# Patient Record
Sex: Female | Born: 1950
Health system: Southern US, Community
[De-identification: ages and names within clinical notes are randomized; demographics above are authoritative.]

## PROBLEM LIST (undated history)

## (undated) DIAGNOSIS — I219 Acute myocardial infarction, unspecified: Secondary | ICD-10-CM

## (undated) DIAGNOSIS — I639 Cerebral infarction, unspecified: Secondary | ICD-10-CM

## (undated) DIAGNOSIS — I10 Essential (primary) hypertension: Secondary | ICD-10-CM

## (undated) DIAGNOSIS — I2510 Atherosclerotic heart disease of native coronary artery without angina pectoris: Secondary | ICD-10-CM

## (undated) DIAGNOSIS — E785 Hyperlipidemia, unspecified: Secondary | ICD-10-CM

## (undated) HISTORY — DX: Hyperlipidemia, unspecified: E78.5

## (undated) HISTORY — DX: Cerebral infarction, unspecified: I63.9

## (undated) HISTORY — DX: Atherosclerotic heart disease of native coronary artery without angina pectoris: I25.10

## (undated) HISTORY — DX: Acute myocardial infarction, unspecified: I21.9

## (undated) HISTORY — DX: Essential (primary) hypertension: I10

---

## 1999-02-26 ENCOUNTER — Encounter: Payer: Self-pay | Admitting: Thoracic Surgery (Cardiothoracic Vascular Surgery)

## 1999-02-26 HISTORY — PX: CARDIAC CATHETERIZATION: SHX172

## 1999-02-27 ENCOUNTER — Encounter: Payer: Self-pay | Admitting: Thoracic Surgery (Cardiothoracic Vascular Surgery)

## 1999-02-27 ENCOUNTER — Inpatient Hospital Stay (HOSPITAL_COMMUNITY): Admission: AD | Admit: 1999-02-27 | Discharge: 1999-03-04 | Payer: Self-pay | Admitting: Cardiology

## 1999-02-27 DIAGNOSIS — I219 Acute myocardial infarction, unspecified: Secondary | ICD-10-CM

## 1999-02-27 HISTORY — DX: Acute myocardial infarction, unspecified: I21.9

## 1999-02-27 HISTORY — PX: CORONARY ARTERY BYPASS GRAFT: SHX141

## 1999-02-28 ENCOUNTER — Encounter: Payer: Self-pay | Admitting: Thoracic Surgery (Cardiothoracic Vascular Surgery)

## 1999-03-01 ENCOUNTER — Encounter: Payer: Self-pay | Admitting: Thoracic Surgery (Cardiothoracic Vascular Surgery)

## 1999-03-02 ENCOUNTER — Encounter: Payer: Self-pay | Admitting: Cardiothoracic Surgery

## 1999-03-03 ENCOUNTER — Encounter: Payer: Self-pay | Admitting: Thoracic Surgery (Cardiothoracic Vascular Surgery)

## 1999-04-07 ENCOUNTER — Encounter (HOSPITAL_COMMUNITY): Admission: RE | Admit: 1999-04-07 | Discharge: 1999-07-06 | Payer: Self-pay | Admitting: Cardiology

## 2008-09-23 ENCOUNTER — Encounter: Payer: Self-pay | Admitting: Cardiology

## 2008-09-23 HISTORY — PX: CARDIOVASCULAR STRESS TEST: SHX262

## 2009-08-29 ENCOUNTER — Encounter: Admission: RE | Admit: 2009-08-29 | Discharge: 2009-08-29 | Payer: Self-pay | Admitting: Obstetrics and Gynecology

## 2010-05-11 ENCOUNTER — Ambulatory Visit: Payer: Self-pay | Admitting: Cardiology

## 2010-07-19 ENCOUNTER — Encounter: Payer: Self-pay | Admitting: Obstetrics and Gynecology

## 2010-10-03 ENCOUNTER — Emergency Department (HOSPITAL_COMMUNITY)
Admission: EM | Admit: 2010-10-03 | Discharge: 2010-10-03 | Disposition: A | Discharge status: Home / Self Care | Payer: BC Managed Care – HMO | Attending: Emergency Medicine | Admitting: Emergency Medicine

## 2010-10-03 DIAGNOSIS — I2510 Atherosclerotic heart disease of native coronary artery without angina pectoris: Secondary | ICD-10-CM | POA: Insufficient documentation

## 2010-10-03 DIAGNOSIS — S8990XA Unspecified injury of unspecified lower leg, initial encounter: Secondary | ICD-10-CM | POA: Insufficient documentation

## 2010-10-03 DIAGNOSIS — M25569 Pain in unspecified knee: Secondary | ICD-10-CM | POA: Insufficient documentation

## 2010-10-03 DIAGNOSIS — H5789 Other specified disorders of eye and adnexa: Secondary | ICD-10-CM | POA: Insufficient documentation

## 2010-10-03 DIAGNOSIS — Y92009 Unspecified place in unspecified non-institutional (private) residence as the place of occurrence of the external cause: Secondary | ICD-10-CM | POA: Insufficient documentation

## 2010-10-03 DIAGNOSIS — Z79899 Other long term (current) drug therapy: Secondary | ICD-10-CM | POA: Insufficient documentation

## 2010-10-03 DIAGNOSIS — I252 Old myocardial infarction: Secondary | ICD-10-CM | POA: Insufficient documentation

## 2010-10-03 DIAGNOSIS — S0083XA Contusion of other part of head, initial encounter: Secondary | ICD-10-CM | POA: Insufficient documentation

## 2010-10-03 DIAGNOSIS — S0510XA Contusion of eyeball and orbital tissues, unspecified eye, initial encounter: Secondary | ICD-10-CM | POA: Insufficient documentation

## 2010-10-03 DIAGNOSIS — W010XXA Fall on same level from slipping, tripping and stumbling without subsequent striking against object, initial encounter: Secondary | ICD-10-CM | POA: Insufficient documentation

## 2010-10-03 DIAGNOSIS — Z8673 Personal history of transient ischemic attack (TIA), and cerebral infarction without residual deficits: Secondary | ICD-10-CM | POA: Insufficient documentation

## 2010-10-03 DIAGNOSIS — M25469 Effusion, unspecified knee: Secondary | ICD-10-CM | POA: Insufficient documentation

## 2010-10-03 DIAGNOSIS — S0003XA Contusion of scalp, initial encounter: Secondary | ICD-10-CM | POA: Insufficient documentation

## 2010-10-03 DIAGNOSIS — S0990XA Unspecified injury of head, initial encounter: Secondary | ICD-10-CM | POA: Insufficient documentation

## 2010-10-03 DIAGNOSIS — Z7982 Long term (current) use of aspirin: Secondary | ICD-10-CM | POA: Insufficient documentation

## 2010-10-03 DIAGNOSIS — H5710 Ocular pain, unspecified eye: Secondary | ICD-10-CM | POA: Insufficient documentation

## 2010-10-08 ENCOUNTER — Other Ambulatory Visit: Payer: Self-pay | Admitting: *Deleted

## 2010-10-08 DIAGNOSIS — I10 Essential (primary) hypertension: Secondary | ICD-10-CM

## 2010-10-08 MED ORDER — METOPROLOL SUCCINATE ER 50 MG PO TB24: 50.0000 mg | ORAL_TABLET | Freq: Every day | ORAL | Status: DC

## 2010-10-08 NOTE — Telephone Encounter (Signed)
escribe medication per fax request  

## 2011-03-01 ENCOUNTER — Other Ambulatory Visit: Payer: Self-pay | Admitting: Cardiology

## 2011-03-02 NOTE — Telephone Encounter (Signed)
escribe medication per fax request  

## 2011-03-10 ENCOUNTER — Other Ambulatory Visit: Payer: Self-pay | Admitting: *Deleted

## 2011-03-10 MED ORDER — HYDROCHLOROTHIAZIDE 25 MG PO TABS: 25.0000 mg | ORAL_TABLET | Freq: Every day | ORAL | Status: DC

## 2011-03-10 NOTE — Telephone Encounter (Signed)
escribe medication per fax request  

## 2011-04-09 ENCOUNTER — Other Ambulatory Visit: Payer: Self-pay | Admitting: Cardiology

## 2011-04-12 ENCOUNTER — Other Ambulatory Visit: Payer: Self-pay | Admitting: Cardiology

## 2011-04-12 ENCOUNTER — Other Ambulatory Visit: Payer: Self-pay

## 2011-05-26 ENCOUNTER — Encounter: Payer: Self-pay | Admitting: Cardiology

## 2011-05-26 ENCOUNTER — Ambulatory Visit (INDEPENDENT_AMBULATORY_CARE_PROVIDER_SITE_OTHER): Payer: BC Managed Care – PPO | Admitting: Cardiology

## 2011-05-26 VITALS — BP 122/72 | HR 82 | Wt 147.8 lb

## 2011-05-26 DIAGNOSIS — I2510 Atherosclerotic heart disease of native coronary artery without angina pectoris: Secondary | ICD-10-CM | POA: Insufficient documentation

## 2011-05-26 DIAGNOSIS — I259 Chronic ischemic heart disease, unspecified: Secondary | ICD-10-CM

## 2011-05-26 DIAGNOSIS — I10 Essential (primary) hypertension: Secondary | ICD-10-CM

## 2011-05-26 DIAGNOSIS — Z951 Presence of aortocoronary bypass graft: Secondary | ICD-10-CM

## 2011-05-26 DIAGNOSIS — E785 Hyperlipidemia, unspecified: Secondary | ICD-10-CM | POA: Insufficient documentation

## 2011-05-26 MED ORDER — QUINAPRIL HCL 20 MG PO TABS: 20.0000 mg | ORAL_TABLET | Freq: Every day | ORAL | Status: DC

## 2011-05-26 MED ORDER — HYDROCHLOROTHIAZIDE 25 MG PO TABS: 25.0000 mg | ORAL_TABLET | Freq: Every day | ORAL | Status: DC

## 2011-05-26 MED ORDER — ATORVASTATIN CALCIUM 80 MG PO TABS: 80.0000 mg | ORAL_TABLET | Freq: Every day | ORAL | Status: DC

## 2011-05-26 NOTE — Assessment & Plan Note (Signed)
She remains asymptomatic and is very active. Her last nuclear stress test was in March of 2010. We will see her back again in 6 months and consider a stress test next year.

## 2011-05-26 NOTE — Progress Notes (Signed)
   Krista Woods Date of Birth: Aug 24, 1950 Medical Record #[id number]  History of Present Illness: Krista Woods is seen today for followup. She continues to do very well. She is status post CABG in September 2000. This included an LIMA graft to LAD, saphenous vein graft to the first diagonal, and saphenous vein graft to the left circumflex coronary. She denies any chest pain or shortness of breath. 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  Current Outpatient Prescriptions on File Prior to Visit  Medication Sig Dispense Refill  . metoprolol (TOPROL-XL) 50 MG 24 hr tablet TAKE 1 TABLET BY MOUTH DAILY  30 tablet  2    No Known Allergies  Past Medical History  Diagnosis Date  . Coronary disease   . HTN (hypertension)   . Hyperlipidemia     Past Surgical History  Procedure Date  . Coronary artery bypass graft September 2000    This included an LIMA graft to the LAD, saphenous vein graft to the first diagonal, saphenous vein graft to the circumflex    History  Smoking status  . Never Smoker   Smokeless tobacco  . Not on file    History  Alcohol Use No    Family History  Problem Relation Age of Onset  . Heart attack Mother     Review of Systems: As noted in history of present illness.  All other systems were reviewed and are negative.  Physical Exam: BP 122/72  Pulse 82  Wt 147 lb 12.8 oz (67.042 kg) The patient is alert and oriented x 3.  The mood and affect are normal.  The skin is warm and dry.  Color is normal.  The HEENT exam reveals that the sclera are nonicteric.  The mucous membranes are moist.  The carotids are 2+ without bruits.  There is no thyromegaly.  There is no JVD.  The lungs are clear.  The chest wall is non tender.  The heart exam reveals a regular rate with a normal S1 and S2.  There are no murmurs, gallops, or rubs.  The PMI is not displaced.   Abdominal exam reveals good bowel sounds.  There is no guarding or rebound.  There  is no hepatosplenomegaly or tenderness.  There are no masses.  Exam of the legs reveal no clubbing, cyanosis, or edema.  The legs are without rashes.  The distal pulses are intact.  Cranial nerves II - XII are intact.  Motor and sensory functions are intact.  The gait is normal.  LABORATORY DATA: ECG demonstrates normal sinus rhythm with LVH. There is a nonspecific T-wave abnormality. No old EKGs are available to review.  Assessment / Plan:

## 2011-05-26 NOTE — Assessment & Plan Note (Signed)
She is on high-dose Lipitor therapy. We will check fasting lab work on her next visit in 6 months.

## 2011-05-26 NOTE — Patient Instructions (Signed)
Continue your current therapy.  I will see you again in 6 months and we will check fasting lab work at that time.

## 2011-05-26 NOTE — Assessment & Plan Note (Signed)
Blood pressure control is good. I have refilled her prescriptions today.

## 2011-07-14 ENCOUNTER — Other Ambulatory Visit: Payer: Self-pay | Admitting: Cardiology

## 2011-07-14 NOTE — Telephone Encounter (Signed)
Refilled metoprolol 

## 2011-11-23 ENCOUNTER — Encounter: Payer: Self-pay | Admitting: Cardiology

## 2011-11-23 ENCOUNTER — Ambulatory Visit (INDEPENDENT_AMBULATORY_CARE_PROVIDER_SITE_OTHER): Payer: BC Managed Care – PPO | Admitting: Cardiology

## 2011-11-23 VITALS — BP 114/62 | HR 76 | Ht 63.0 in | Wt 155.0 lb

## 2011-11-23 DIAGNOSIS — I10 Essential (primary) hypertension: Secondary | ICD-10-CM

## 2011-11-23 DIAGNOSIS — I259 Chronic ischemic heart disease, unspecified: Secondary | ICD-10-CM

## 2011-11-23 DIAGNOSIS — E785 Hyperlipidemia, unspecified: Secondary | ICD-10-CM

## 2011-11-23 DIAGNOSIS — I2510 Atherosclerotic heart disease of native coronary artery without angina pectoris: Secondary | ICD-10-CM

## 2011-11-23 NOTE — Assessment & Plan Note (Signed)
She is now 13 years status post CABG. Her last nuclear stress test was in 2010. We will update this. She is asymptomatic and we will continue with her current medical therapy.

## 2011-11-23 NOTE — Patient Instructions (Signed)
Continue your current medications  We will schedule you for fasting lab work and a stress nuclear study.  Cut out the sweets in your diet and lose weight.

## 2011-11-23 NOTE — Assessment & Plan Note (Signed)
I'm discouraged with her weight gain. I stressed the importance of avoidance of sweets. She needs to lose weight. She is due for followup fasting lab work and we will check chemistries and a lipid panel.

## 2011-11-23 NOTE — Assessment & Plan Note (Signed)
Blood pressure control is good on Toprol, ACE inhibitor, and HCTZ.

## 2011-11-23 NOTE — Progress Notes (Signed)
   Krista Woods Date of Birth: 10-30-1950 Medical Record #[id number]  History of Present Illness: Krista Woods is seen today for followup.  She is status post CABG in September 2000. This included an LIMA graft to LAD, saphenous vein graft to the first diagonal, and saphenous vein graft to the left circumflex coronary. She denies any chest pain or shortness of breath. She has retired from her job in the school system. He swimming or doing aerobic exercise. She has gained 8 pounds since her last visit. She states her daughter has been bringing her cakes and pies.  Current Outpatient Prescriptions on File Prior to Visit  Medication Sig Dispense Refill  . aspirin 325 MG tablet Take 325 mg by mouth daily.        [name] Kitchen atorvastatin (LIPITOR) 80 MG tablet Take 1 tablet (80 mg total) by mouth daily.  90 tablet  3  . hydrochlorothiazide (HYDRODIURIL) 25 MG tablet Take 1 tablet (25 mg total) by mouth daily.  90 tablet  3  . metoprolol succinate (TOPROL-XL) 50 MG 24 hr tablet TAKE 1 TABLET BY MOUTH DAILY  30 tablet  5  . quinapril (ACCUPRIL) 20 MG tablet Take 1 tablet (20 mg total) by mouth daily.  90 tablet  3    No Known Allergies  Past Medical History  Diagnosis Date  . Coronary disease   . HTN (hypertension)   . Hyperlipidemia     Past Surgical History  Procedure Date  . Coronary artery bypass graft September 2000    This included an LIMA graft to the LAD, saphenous vein graft to the first diagonal, saphenous vein graft to the circumflex  . Cardiac catheterization 02/26/1999    ef 65%  . Cardiovascular stress test 09/23/2008    EF 71%, NORMAL.    History  Smoking status  . Never Smoker   Smokeless tobacco  . Not on file    History  Alcohol Use No    Family History  Problem Relation Age of Onset  . Heart attack Mother     Review of Systems: As noted in history of present illness.  All other systems were reviewed and are negative.  Physical Exam: BP 114/62  Pulse 76  Ht 5\' 3"  (1.6 m)   Wt 155 lb (70.308 kg)  BMI 27.46 kg/m2 The patient is alert and oriented x 3.   The HEENT exam reveals that the sclera are nonicteric.  The mucous membranes are moist.  The carotids are 2+ without bruits.  There is no thyromegaly.  There is no JVD.  The lungs are clear.  The chest wall is non tender.  The heart exam reveals a regular rate with a normal S1 and S2.  There are no murmurs, gallops, or rubs.  The PMI is not displaced.   Abdominal exam reveals good bowel sounds.  There is no guarding or rebound.  There is no hepatosplenomegaly or tenderness.  There are no masses.  Exam of the legs reveal no clubbing, cyanosis, or edema.    The distal pulses are intact.  Cranial nerves II - XII are intact.  Motor and sensory functions are intact.  The gait is normal.  LABORATORY DATA:   Assessment / Plan:

## 2011-12-08 ENCOUNTER — Ambulatory Visit (HOSPITAL_COMMUNITY): Payer: BC Managed Care – PPO | Attending: Cardiovascular Disease | Admitting: Radiology

## 2011-12-08 VITALS — BP 147/91 | Ht 63.0 in | Wt 154.0 lb

## 2011-12-08 DIAGNOSIS — E785 Hyperlipidemia, unspecified: Secondary | ICD-10-CM | POA: Insufficient documentation

## 2011-12-08 DIAGNOSIS — R9431 Abnormal electrocardiogram [ECG] [EKG]: Secondary | ICD-10-CM

## 2011-12-08 DIAGNOSIS — I2510 Atherosclerotic heart disease of native coronary artery without angina pectoris: Secondary | ICD-10-CM | POA: Insufficient documentation

## 2011-12-08 DIAGNOSIS — I10 Essential (primary) hypertension: Secondary | ICD-10-CM

## 2011-12-08 MED ORDER — TECHNETIUM TC 99M TETROFOSMIN IV KIT
30.0000 | PACK | Freq: Once | INTRAVENOUS | Status: AC | PRN
  Administered 2011-12-08: 30 via INTRAVENOUS

## 2011-12-08 MED ORDER — TECHNETIUM TC 99M TETROFOSMIN IV KIT
10.0000 | PACK | Freq: Once | INTRAVENOUS | Status: AC | PRN
  Administered 2011-12-08: 10 via INTRAVENOUS

## 2011-12-08 NOTE — Progress Notes (Signed)
[facility] SITE 3 NUCLEAR MED [address] [place] [address] [phone]  Cardiology Nuclear Med Study  Krista Woods is a 61 y.o. female     MRN : [id number]     DOB: [birth year]  Procedure Date: 12/08/2011  Nuclear Med Background Indication for Stress Test:  Evaluation for Ischemia, Graft Patency and Abnormal EKG History:  8/00 Heart Cath: EF: 65%--CABG-9/00 x3,3/10 EF:71% (-) ischemia Cardiac Risk Factors: Family History - CAD, Hypertension and Lipids  Symptoms:  n/a   Nuclear Pre-Procedure Caffeine/Decaff Intake:  None > 12 hrs NPO After: 9:00pm   Lungs:  clear O2 Sat: 98% on room air. IV 0.9% NS with Angio Cath:  22g  IV Site: R Antecubital x 1, tolerated well IV Started by:  Irean Hong, RN  Chest Size (in):  36 Cup Size: C  Height: 5\' 3"  (1.6 m)  Weight:  154 lb (69.854 kg)  BMI:  Body mass index is 27.28 kg/(m^2). Tech Comments:  Held toprol x 48 hrs. This patient walked the treadmill without any problems. She had a very (+) EKG, her pictures were checked and preliminarily they were fine.     Nuclear Med Study 1 or 2 day study: 1 day  Stress Test Type:  Stress  Reading MD: Charlton Haws, MD  Order Authorizing Provider:  Duron Meister [place], MD  Resting Radionuclide: Technetium 41m Tetrofosmin  Resting Radionuclide Dose: 11.0 mCi   Stress Radionuclide:  Technetium 26m Tetrofosmin  Stress Radionuclide Dose: 33.0 mCi           Stress Protocol Rest HR: 67 Stress HR: 153  Rest BP: 147/91 Stress BP: 211/78  Exercise Time (min): 6:30 METS: 7.70   Predicted Max HR: 160 bpm % Max HR: 95.62 bpm Rate Pressure Product: 21308   Dose of Adenosine (mg):  n/a Dose of Lexiscan: n/a mg  Dose of Atropine (mg): n/a Dose of Dobutamine: n/a mcg/kg/min (at max HR)  Stress Test Technologist: Milana Na, EMT-P  Nuclear Technologist:  Domenic Polite, CNMT     Rest Procedure:  Myocardial perfusion imaging was performed at rest 45 minutes following the  intravenous administration of Technetium 69m Tetrofosmin. Rest ECG: NSR with non-specific ST-T wave changes  Stress Procedure:  The patient performed treadmill exercise using a Bruce  Protocol for 6:30 minutes. The patient stopped due to fatigue and denied any chest pain.  There were + significant ST-T wave changes.  Technetium 7m Tetrofosmin was injected at peak exercise and myocardial perfusion imaging was performed after a brief delay. Stress ECG: Significant ST abnormalities consistent with ischemia.  QPS Raw Data Images:  Normal; no motion artifact; normal heart/lung ratio. Stress Images:  Normal homogeneous uptake in all areas of the myocardium. Rest Images:  Normal homogeneous uptake in all areas of the myocardium. Subtraction (SDS):  Normal Transient Ischemic Dilatation (Normal <1.22):  1.08 Lung/Heart Ratio (Normal <0.45):  0.29  Quantitative Gated Spect Images QGS EDV:  59 ml QGS ESV:  17 ml  Impression Exercise Capacity:  Fair exercise capacity. BP Response:  Hypertensive blood pressure response. Clinical Symptoms:  No significant symptoms noted. ECG Impression:  Baseline ECG LVH Comparison with Prior Nuclear Study: No images to compare  Overall Impression:  Low risk stress nuclear study. Normal nuclear images.  Baseline ECG LVH with T wave changes during exercise Deemed nondiagnostic due to baseline ECG  LV Ejection Fraction: 71%.  LV Wall Motion:  NL LV Function; NL Wall Motion   Charlton Haws

## 2012-01-07 ENCOUNTER — Other Ambulatory Visit: Payer: Self-pay | Admitting: Cardiology

## 2012-05-19 ENCOUNTER — Other Ambulatory Visit: Payer: Self-pay

## 2012-05-19 MED ORDER — HYDROCHLOROTHIAZIDE 25 MG PO TABS: 25.0000 mg | ORAL_TABLET | Freq: Every day | ORAL | Status: DC

## 2012-05-26 ENCOUNTER — Other Ambulatory Visit: Payer: Self-pay | Admitting: *Deleted

## 2012-05-26 MED ORDER — QUINAPRIL HCL 20 MG PO TABS: 20.0000 mg | ORAL_TABLET | Freq: Every day | ORAL | Status: DC

## 2012-05-29 ENCOUNTER — Other Ambulatory Visit: Payer: Self-pay

## 2012-05-29 MED ORDER — ATORVASTATIN CALCIUM 80 MG PO TABS: 80.0000 mg | ORAL_TABLET | Freq: Every day | ORAL | Status: DC

## 2012-07-07 ENCOUNTER — Other Ambulatory Visit: Payer: Self-pay

## 2012-07-07 MED ORDER — METOPROLOL SUCCINATE ER 50 MG PO TB24: 50.0000 mg | ORAL_TABLET | Freq: Every day | ORAL | Status: DC

## 2012-11-24 ENCOUNTER — Other Ambulatory Visit: Payer: Self-pay | Admitting: *Deleted

## 2012-11-24 MED ORDER — QUINAPRIL HCL 20 MG PO TABS: 20.0000 mg | ORAL_TABLET | Freq: Every day | ORAL | Status: DC

## 2013-01-05 ENCOUNTER — Other Ambulatory Visit: Payer: Self-pay | Admitting: Cardiology

## 2013-01-05 ENCOUNTER — Telehealth: Payer: Self-pay | Admitting: Cardiology

## 2013-01-05 MED ORDER — METOPROLOL SUCCINATE ER 50 MG PO TB24: 50.0000 mg | ORAL_TABLET | Freq: Every day | ORAL | Status: DC

## 2013-01-05 NOTE — Telephone Encounter (Signed)
Patient called no answer.Left message on personal voice mail metoprolol refill sent to pharmacy,keep appointment scheduled with Norma Fredrickson NP 01/23/13.

## 2013-01-23 ENCOUNTER — Ambulatory Visit (INDEPENDENT_AMBULATORY_CARE_PROVIDER_SITE_OTHER): Payer: BC Managed Care – PPO | Admitting: Nurse Practitioner

## 2013-01-23 ENCOUNTER — Encounter: Payer: Self-pay | Admitting: Nurse Practitioner

## 2013-01-23 VITALS — BP 170/74 | HR 64 | Ht 63.0 in | Wt 157.8 lb

## 2013-01-23 DIAGNOSIS — E785 Hyperlipidemia, unspecified: Secondary | ICD-10-CM

## 2013-01-23 DIAGNOSIS — I25810 Atherosclerosis of coronary artery bypass graft(s) without angina pectoris: Secondary | ICD-10-CM

## 2013-01-23 MED ORDER — ATORVASTATIN CALCIUM 80 MG PO TABS: 80.0000 mg | ORAL_TABLET | Freq: Every day | ORAL | Status: DC

## 2013-01-23 MED ORDER — HYDROCHLOROTHIAZIDE 25 MG PO TABS: 25.0000 mg | ORAL_TABLET | Freq: Every day | ORAL | Status: DC

## 2013-01-23 MED ORDER — METOPROLOL SUCCINATE ER 50 MG PO TB24: 50.0000 mg | ORAL_TABLET | Freq: Every day | ORAL | Status: DC

## 2013-01-23 MED ORDER — QUINAPRIL HCL 20 MG PO TABS: 20.0000 mg | ORAL_TABLET | Freq: Every day | ORAL | Status: DC

## 2013-01-23 NOTE — Patient Instructions (Addendum)
We will check fasting labs tomorrow morning - nothing to eat/drink after midnight tonight   BP check here tomorrow  Restrict your salt  Monitor your blood pressure at home - keep a diary - let us know in the next few days if your readings are staying above 140/90  I will see you in a month with your BP diary  Call the [facility] office at [phone] if you have any questions, problems or concerns.

## 2013-01-23 NOTE — Progress Notes (Signed)
[name] Date of Birth: 1951/06/24 Medical Record #[id number]  History of Present Illness: Ms. Cookson comes back today for a follow up visit. Seen for Dr. [place]. Has known CAD with remote CABG in 2000, last stress test in 2010. Other issues include HTN and HLD.Negative Myoview last year.   Last seen here last May. Was doing ok.   Comes back today. Here alone. BP is up. She does not check it at home but does have a BP monitor. Feels great. No chest pain. Remains very active. Exercising regularly. Has been eating more salt, especially yesterday. No swelling. No chest pain. Not short of breath. Needs all of her medicines refilled. Has had no recent labs anywhere. No PCP.   Current Outpatient Prescriptions  Medication Sig Dispense Refill  . aspirin 325 MG tablet Take 325 mg by mouth daily.        [name] Kitchen atorvastatin (LIPITOR) 80 MG tablet Take 1 tablet (80 mg total) by mouth daily.  90 tablet  3  . hydrochlorothiazide (HYDRODIURIL) 25 MG tablet Take 1 tablet (25 mg total) by mouth daily.  90 tablet  3  . metoprolol succinate (TOPROL-XL) 50 MG 24 hr tablet Take 1 tablet (50 mg total) by mouth daily. Take with or immediately following a meal.  90 tablet  3  . Multiple Vitamin (MULTIVITAMIN) capsule Take 1 capsule by mouth daily.      . quinapril (ACCUPRIL) 20 MG tablet Take 1 tablet (20 mg total) by mouth daily.  90 tablet  3   No current facility-administered medications for this visit.    No Known Allergies  Past Medical History  Diagnosis Date  . Coronary disease     past CABG in September 2000 with LIMA to LAD, SVG to 1st DX, SVG to LCX  . HTN (hypertension)   . Hyperlipidemia     Past Surgical History  Procedure Laterality Date  . Coronary artery bypass graft  September 2000    This included an LIMA graft to the LAD, saphenous vein graft to the first diagonal, saphenous vein graft to the circumflex  . Cardiac catheterization  02/26/1999    ef 65%  . Cardiovascular stress test   09/23/2008    EF 71%, NORMAL.    History  Smoking status  . Never Smoker   Smokeless tobacco  . Not on file    History  Alcohol Use No    Family History  Problem Relation Age of Onset  . Heart attack Mother     Review of Systems: The review of systems is per the HPI.  All other systems were reviewed and are negative.  Physical Exam: BP 170/74  Pulse 64  Ht 5\' 3"  (1.6 m)  Wt 157 lb 12.8 oz (71.578 kg)  BMI 27.96 kg/m2 Patient is very pleasant and in no acute distress. Skin is warm and dry. Color is normal.  HEENT is unremarkable. Normocephalic/atraumatic. PERRL. Sclera are nonicteric. Neck is supple. No masses. No JVD. Lungs are clear. Cardiac exam shows a regular rate and rhythm. Abdomen is soft. Extremities are without edema. Gait and ROM are intact. No gross neurologic deficits noted.  LABORATORY DATA: No results found for this basename: WBC,  HGB,  HCT,  PLT,  GLUCOSE,  CHOL,  TRIG,  HDL,  LDLDIRECT,  LDLCALC,  ALT,  AST,  NA,  K,  CL,  CREATININE,  BUN,  CO2,  TSH,  PSA,  INR,  GLUF,  HGBA1C,  MICROALBUR   Myoview  Impression from May 2013 Exercise Capacity: Fair exercise capacity.  BP Response: Hypertensive blood pressure response.  Clinical Symptoms: No significant symptoms noted.  ECG Impression: Baseline ECG LVH  Comparison with Prior Nuclear Study: No images to compare  Overall Impression: Low risk stress nuclear study. Normal nuclear images. Baseline ECG LVH with T wave changes during exercise  Deemed nondiagnostic due to baseline ECG  LV Ejection Fraction: 71%. LV Wall Motion: NL LV Function; NL Wall Motion  Charlton Haws   Assessment / Plan: 1. CAD - remote CABG 14 years ago - negative Myoview in 2013. No symptoms reported.   2. HTN - BP is up - she is agreeable to checking at home - cutting back the salt. Will recheck her BP when she comes tomorrow for her labs. I will see her back in about 4 to 5 weeks for a BP check/visit.   3. HLD - not fasting today -  will recheck fasting labs tomorrow am.   Patient is agreeable to this plan and will call if any problems develop in the interim.   Rosalio Macadamia, RN, ANP-C [facility] [address] [place], [place]  [address]

## 2013-01-24 ENCOUNTER — Telehealth: Payer: Self-pay | Admitting: *Deleted

## 2013-01-24 ENCOUNTER — Other Ambulatory Visit (INDEPENDENT_AMBULATORY_CARE_PROVIDER_SITE_OTHER): Payer: BC Managed Care – PPO

## 2013-01-24 DIAGNOSIS — E785 Hyperlipidemia, unspecified: Secondary | ICD-10-CM

## 2013-01-24 DIAGNOSIS — I25810 Atherosclerosis of coronary artery bypass graft(s) without angina pectoris: Secondary | ICD-10-CM

## 2013-01-24 LAB — LIPID PANEL
Cholesterol: 159 mg/dL (ref 0–200)
HDL: 66.4 mg/dL (ref >39.00)
LDL Cholesterol: 83 mg/dL (ref 0–99)
Total CHOL/HDL Ratio: 2
Triglycerides: 49 mg/dL (ref 0.0–149.0)
VLDL: 9.8 mg/dL (ref 0.0–40.0)

## 2013-01-24 LAB — HEPATIC FUNCTION PANEL
ALT: 17 U/L (ref 0–35)
AST: 21 U/L (ref 0–37)
Albumin: 3.7 g/dL (ref 3.5–5.2)
Alkaline Phosphatase: 56 U/L (ref 39–117)
Bilirubin, Direct: 0 mg/dL (ref 0.0–0.3)
Total Bilirubin: 0.8 mg/dL (ref 0.3–1.2)
Total Protein: 6.7 g/dL (ref 6.0–8.3)

## 2013-01-24 LAB — BASIC METABOLIC PANEL
BUN: 15 mg/dL (ref 6–23)
CO2: 31 mEq/L (ref 19–32)
Calcium: 9.6 mg/dL (ref 8.4–10.5)
Chloride: 103 mEq/L (ref 96–112)
Creatinine, Ser: 0.7 mg/dL (ref 0.4–1.2)
GFR: 102.24 mL/min (ref >60.00)
Glucose, Bld: 118 mg/dL — ABNORMAL HIGH (ref 70–99)
Potassium: 3.9 mEq/L (ref 3.5–5.1)
Sodium: 140 mEq/L (ref 135–145)

## 2013-01-24 NOTE — Telephone Encounter (Signed)
Pt came into today to get bp checked with pt's bp cuff, Manually left arm at 170/84, with pt's cuff Left arm 158/89 pt will see Lawson Fiscal back in a month

## 2013-02-28 ENCOUNTER — Ambulatory Visit: Payer: BC Managed Care – PPO | Admitting: Nurse Practitioner

## 2014-01-18 ENCOUNTER — Other Ambulatory Visit: Payer: Self-pay | Admitting: Nurse Practitioner

## 2014-02-21 ENCOUNTER — Other Ambulatory Visit: Payer: Self-pay | Admitting: Cardiology

## 2014-02-21 MED ORDER — QUINAPRIL HCL 20 MG PO TABS: 20.0000 mg | ORAL_TABLET | Freq: Every day | ORAL | Status: DC

## 2014-02-21 NOTE — Telephone Encounter (Signed)
Patient walked in office stated she needed 2 refills metoprolol and quinapril.Refills sent to pharmacy.Advised to keep appointment with Dr.Jordan 03/08/14 at 2:15 pm.

## 2014-03-07 ENCOUNTER — Telehealth: Payer: Self-pay | Admitting: Cardiology

## 2014-03-07 NOTE — Telephone Encounter (Signed)
Returned call to patient she wanted to verify tomorrows appt.Appointment scheduled with Dr.Jordan 03/08/14 at 2:15 pm.Advised she can take all medications as normal.

## 2014-03-07 NOTE — Telephone Encounter (Signed)
New Message  Pt requests a call back to determine if she should take her medication in the morning before her ov// please call

## 2014-03-08 ENCOUNTER — Encounter: Payer: Self-pay | Admitting: Cardiology

## 2014-03-08 ENCOUNTER — Ambulatory Visit (INDEPENDENT_AMBULATORY_CARE_PROVIDER_SITE_OTHER): Payer: BC Managed Care – PPO | Admitting: Cardiology

## 2014-03-08 VITALS — BP 152/90 | HR 60 | Ht 63.0 in | Wt 157.5 lb

## 2014-03-08 DIAGNOSIS — Z951 Presence of aortocoronary bypass graft: Secondary | ICD-10-CM

## 2014-03-08 DIAGNOSIS — I10 Essential (primary) hypertension: Secondary | ICD-10-CM

## 2014-03-08 DIAGNOSIS — E785 Hyperlipidemia, unspecified: Secondary | ICD-10-CM

## 2014-03-08 DIAGNOSIS — I2510 Atherosclerotic heart disease of native coronary artery without angina pectoris: Secondary | ICD-10-CM

## 2014-03-08 MED ORDER — QUINAPRIL HCL 40 MG PO TABS: 40.0000 mg | ORAL_TABLET | Freq: Every day | ORAL | Status: DC

## 2014-03-08 NOTE — Progress Notes (Signed)
Krista Woods Date of Birth: 18-Oct-1950 Medical Record #[id number]  History of Present Illness: Ms. Krista Woods comes back today for a yearly follow up visit. Has known CAD with remote CABG in 2000, last stress test in 2010. Other issues include HTN and HLD. Negative Myoview June 2013. She was noted to be hypertensive on last visit. Has not really been monitoring her BP. Feels great. Exercising regularly and eats a very healthy diet.   Current Outpatient Prescriptions  Medication Sig Dispense Refill  . aspirin 325 MG tablet Take 325 mg by mouth daily.        [name] Kitchen atorvastatin (LIPITOR) 80 MG tablet TAKE 1 TABLET BY MOUTH EVERY DAY  90 tablet  1  . hydrochlorothiazide (HYDRODIURIL) 25 MG tablet Take 1 tablet (25 mg total) by mouth daily.  90 tablet  3  . metoprolol succinate (TOPROL-XL) 50 MG 24 hr tablet TAKE 1 TABLET BY MOUTH DAILY WITH OR IMMEDIATELY FOLLOWING A MEAL  30 tablet  1  . Multiple Vitamin (MULTIVITAMIN) capsule Take 1 capsule by mouth daily.      . quinapril (ACCUPRIL) 40 MG tablet Take 1 tablet (40 mg total) by mouth at bedtime.  90 tablet  3   No current facility-administered medications for this visit.    No Known Allergies  Past Medical History  Diagnosis Date  . Coronary disease     past CABG in September 2000 with LIMA to LAD, SVG to 1st DX, SVG to LCX  . HTN (hypertension)   . Hyperlipidemia     Past Surgical History  Procedure Laterality Date  . Coronary artery bypass graft  September 2000    This included an LIMA graft to the LAD, saphenous vein graft to the first diagonal, saphenous vein graft to the circumflex  . Cardiac catheterization  02/26/1999    ef 65%  . Cardiovascular stress test  09/23/2008    EF 71%, NORMAL.    History  Smoking status  . Never Smoker   Smokeless tobacco  . Not on file    History  Alcohol Use No    Family History  Problem Relation Age of Onset  . Heart attack Mother     Review of Systems: The review of systems is per the  HPI.  All other systems were reviewed and are negative.  Physical Exam: BP 152/90  Pulse 60  Ht 5\' 3"  (1.6 m)  Wt 157 lb 8 oz (71.442 kg)  BMI 27.91 kg/m2 Patient is very pleasant and in no acute distress. Skin is warm and dry. Color is normal.  HEENT is unremarkable. Normocephalic/atraumatic. PERRL. Sclera are nonicteric. Neck is supple. No masses. No JVD. Lungs are clear. Cardiac exam shows a regular rate and rhythm. Abdomen is soft. Extremities are without edema. Gait and ROM are intact. No gross neurologic deficits noted.  LABORATORY DATA: No results found for this basename: WBC,  HGB,  HCT,  PLT,  GLUCOSE,  CHOL,  TRIG,  HDL,  LDLDIRECT,  LDLCALC,  ALT,  AST,  NA,  K,  CL,  CREATININE,  BUN,  CO2,  TSH,  PSA,  INR,  GLUF,  HGBA1C,  MICROALBUR   Myoview Impression from May 2013 Exercise Capacity: Fair exercise capacity.  BP Response: Hypertensive blood pressure response.  Clinical Symptoms: No significant symptoms noted.  ECG Impression: Baseline ECG LVH  Comparison with Prior Nuclear Study: No images to compare  Overall Impression: Low risk stress nuclear study. Normal nuclear images. Baseline ECG LVH with  T wave changes during exercise  Deemed nondiagnostic due to baseline ECG  LV Ejection Fraction: 71%. LV Wall Motion: NL LV Function; NL Wall Motion  Krista Woods Krista Woods  Ecg: NSR with rate 60 bpm. Normal.  Assessment / Plan: 1. CAD - remote CABG 15 years ago - negative Myoview in 2013. No symptoms reported.   2. HTN - BP is elevated - I have recommended increasing Accupril to 40 mg daily. Continue Toprol XL and HCTZ. Follow up in 3 months. She is to monitor BP and keep a diary.  3. HLD - will check fasting labs today.

## 2014-03-08 NOTE — Patient Instructions (Signed)
Increase quinapril to 40 mg daily.  Continue your other therapy  We will check lab work today.   Keep track of your blood pressure and keep a diary.

## 2014-03-09 LAB — CBC WITH DIFFERENTIAL/PLATELET
BASOS ABS: 0.1 10*3/uL (ref 0.0–0.1)
Basophils Relative: 1 % (ref 0–1)
EOS ABS: 0.1 10*3/uL (ref 0.0–0.7)
Eosinophils Relative: 1 % (ref 0–5)
HCT: 39.7 % (ref 36.0–46.0)
Hemoglobin: 13.8 g/dL (ref 12.0–15.0)
LYMPHS PCT: 30 % (ref 12–46)
Lymphs Abs: 2 10*3/uL (ref 0.7–4.0)
MCH: 31.2 pg (ref 26.0–34.0)
MCHC: 34.8 g/dL (ref 30.0–36.0)
MCV: 89.6 fL (ref 78.0–100.0)
MONO ABS: 0.4 10*3/uL (ref 0.1–1.0)
Monocytes Relative: 6 % (ref 3–12)
Neutro Abs: 4.2 10*3/uL (ref 1.7–7.7)
Neutrophils Relative %: 62 % (ref 43–77)
PLATELETS: 270 10*3/uL (ref 150–400)
RBC: 4.43 MIL/uL (ref 3.87–5.11)
RDW: 14.3 % (ref 11.5–15.5)
WBC: 6.7 10*3/uL (ref 4.0–10.5)

## 2014-03-09 LAB — LIPID PANEL
CHOL/HDL RATIO: 2.2 ratio
Cholesterol: 152 mg/dL (ref 0–200)
HDL: 68 mg/dL (ref >39)
LDL Cholesterol: 72 mg/dL (ref 0–99)
Triglycerides: 61 mg/dL (ref <150)
VLDL: 12 mg/dL (ref 0–40)

## 2014-03-09 LAB — HEPATIC FUNCTION PANEL
ALT: 13 U/L (ref 0–35)
AST: 19 U/L (ref 0–37)
Albumin: 4.5 g/dL (ref 3.5–5.2)
Alkaline Phosphatase: 64 U/L (ref 39–117)
BILIRUBIN DIRECT: 0.1 mg/dL (ref 0.0–0.3)
BILIRUBIN TOTAL: 0.7 mg/dL (ref 0.2–1.2)
Indirect Bilirubin: 0.6 mg/dL (ref 0.2–1.2)
Total Protein: 7.1 g/dL (ref 6.0–8.3)

## 2014-03-09 LAB — BASIC METABOLIC PANEL
BUN: 11 mg/dL (ref 6–23)
CALCIUM: 10.2 mg/dL (ref 8.4–10.5)
CHLORIDE: 101 meq/L (ref 96–112)
CO2: 30 mEq/L (ref 19–32)
Creat: 0.73 mg/dL (ref 0.50–1.10)
Glucose, Bld: 89 mg/dL (ref 70–99)
Potassium: 3.8 mEq/L (ref 3.5–5.3)
Sodium: 141 mEq/L (ref 135–145)

## 2014-03-16 ENCOUNTER — Other Ambulatory Visit: Payer: Self-pay | Admitting: Nurse Practitioner

## 2014-04-01 ENCOUNTER — Other Ambulatory Visit: Payer: Self-pay | Admitting: Cardiology

## 2014-04-18 ENCOUNTER — Telehealth: Payer: Self-pay | Admitting: Cardiology

## 2014-04-18 NOTE — Telephone Encounter (Addendum)
Malachy Mood This patient stopped by and scheduled an appt with Dr. [place] for January 2016.  She is wondering why she is coming in as she was just seen in September and he said 6 months for the next appt.  Can you give her a call at [phone] and let her know if she should keep this appt.  Addendum:  Please disregard this note as patient decided to keep the appt in January.

## 2014-04-18 NOTE — Telephone Encounter (Signed)
Returned call to patient no answer.Left message on personal voice mail at 03/08/14 office visit with Dr.Jordan he advised to follow up with him in 3 months.Advised to call sooner if needed.

## 2014-06-13 ENCOUNTER — Other Ambulatory Visit: Payer: Self-pay | Admitting: Cardiology

## 2014-07-08 ENCOUNTER — Encounter: Payer: Self-pay | Admitting: Cardiology

## 2014-07-08 ENCOUNTER — Ambulatory Visit (INDEPENDENT_AMBULATORY_CARE_PROVIDER_SITE_OTHER): Payer: BC Managed Care – PPO | Admitting: Cardiology

## 2014-07-08 VITALS — BP 160/80 | HR 66 | Ht 63.0 in | Wt 163.8 lb

## 2014-07-08 DIAGNOSIS — E785 Hyperlipidemia, unspecified: Secondary | ICD-10-CM

## 2014-07-08 DIAGNOSIS — I2510 Atherosclerotic heart disease of native coronary artery without angina pectoris: Secondary | ICD-10-CM

## 2014-07-08 DIAGNOSIS — I10 Essential (primary) hypertension: Secondary | ICD-10-CM

## 2014-07-08 DIAGNOSIS — Z951 Presence of aortocoronary bypass graft: Secondary | ICD-10-CM

## 2014-07-08 MED ORDER — AMLODIPINE BESYLATE 2.5 MG PO TABS: 2.5000 mg | ORAL_TABLET | Freq: Every day | ORAL | Status: DC

## 2014-07-08 MED ORDER — ATORVASTATIN CALCIUM 80 MG PO TABS: 80.0000 mg | ORAL_TABLET | Freq: Every day | ORAL | Status: DC

## 2014-07-08 MED ORDER — METOPROLOL SUCCINATE ER 50 MG PO TB24: 50.0000 mg | ORAL_TABLET | Freq: Every day | ORAL | Status: DC

## 2014-07-08 MED ORDER — HYDROCHLOROTHIAZIDE 25 MG PO TABS: 25.0000 mg | ORAL_TABLET | Freq: Every day | ORAL | Status: DC

## 2014-07-08 NOTE — Progress Notes (Signed)
Kathi Ludwig Date of Birth: 1950/08/26 Medical Record #[id number]  History of Present Illness: Ms. Vaeth is seen for follow up of CAD and HTN.  Has known CAD with remote CABG in 2000. Other issues include HTN and HLD. Negative Myoview June 2013. She was noted to be hypertensive on last visit. Accupril was increased but BP remains elevated by her home BP monitor. Feels great. Exercising regularly and eats a very healthy diet. No chest pain or SOB. No palpitations.   Current Outpatient Prescriptions  Medication Sig Dispense Refill  . aspirin 325 MG tablet Take 325 mg by mouth daily.      [name] Kitchen atorvastatin (LIPITOR) 80 MG tablet Take 1 tablet (80 mg total) by mouth daily. 90 tablet 3  . hydrochlorothiazide (HYDRODIURIL) 25 MG tablet Take 1 tablet (25 mg total) by mouth daily. 90 tablet 3  . metoprolol succinate (TOPROL-XL) 50 MG 24 hr tablet Take 1 tablet (50 mg total) by mouth daily. Take with or immediately following a meal. 90 tablet 3  . Multiple Vitamin (MULTIVITAMIN) capsule Take 1 capsule by mouth daily.    . quinapril (ACCUPRIL) 40 MG tablet Take 1 tablet (40 mg total) by mouth at bedtime. 90 tablet 3  . amLODipine (NORVASC) 2.5 MG tablet Take 1 tablet (2.5 mg total) by mouth daily. 180 tablet 3   No current facility-administered medications for this visit.    No Known Allergies  Past Medical History  Diagnosis Date  . Coronary disease     past CABG in September 2000 with LIMA to LAD, SVG to 1st DX, SVG to LCX  . HTN (hypertension)   . Hyperlipidemia     Past Surgical History  Procedure Laterality Date  . Coronary artery bypass graft  September 2000    This included an LIMA graft to the LAD, saphenous vein graft to the first diagonal, saphenous vein graft to the circumflex  . Cardiac catheterization  02/26/1999    ef 65%  . Cardiovascular stress test  09/23/2008    EF 71%, NORMAL.    History  Smoking status  . Never Smoker   Smokeless tobacco  . Not on file    History    Alcohol Use No    Family History  Problem Relation Age of Onset  . Heart attack Mother     Review of Systems: The review of systems is per the HPI.  All other systems were reviewed and are negative.  Physical Exam: BP 160/80 mmHg  Pulse 66  Ht 5\' 3"  (1.6 m)  Wt 163 lb 12.8 oz (74.299 kg)  BMI 29.02 kg/m2 Patient is very pleasant and in no acute distress. Skin is warm and dry. Color is normal.  HEENT is unremarkable. Normocephalic/atraumatic. PERRL. Sclera are nonicteric. Neck is supple. No masses. No JVD. Lungs are clear. Cardiac exam shows a regular rate and rhythm. Abdomen is soft. Extremities are without edema. Gait and ROM are intact. No gross neurologic deficits noted.  LABORATORY DATA: Lab Results  Component Value Date   WBC 6.7 03/08/2014   HGB 13.8 03/08/2014   HCT 39.7 03/08/2014   PLT 270 03/08/2014   GLUCOSE 89 03/08/2014   CHOL 152 03/08/2014   TRIG 61 03/08/2014   HDL 68 03/08/2014   LDLCALC 72 03/08/2014   ALT 13 03/08/2014   AST 19 03/08/2014   NA 141 03/08/2014   K 3.8 03/08/2014   CL 101 03/08/2014   CREATININE 0.73 03/08/2014   BUN 11 03/08/2014  CO2 30 03/08/2014    Assessment / Plan: 1. CAD - remote CABG 15 years ago - negative Myoview in 2013. No symptoms reported.   2. HTN - BP remains elevated - will continue current therapy and add amlodipine 2.5 mg daily. She is to monitor BP and keep a diary.  3. HLD - excellent control- continue lipitor.  I will follow up in 6 months with fasting labs.

## 2014-07-08 NOTE — Patient Instructions (Signed)
Continue your current therapy  We will add amlodipine 2.5 mg daily for blood pressure.  I will see you in 6 months with fasting lab work.

## 2014-10-28 LAB — BASIC METABOLIC PANEL
BUN: 10 mg/dL (ref 6–23)
CALCIUM: 9.3 mg/dL (ref 8.4–10.5)
CO2: 29 mEq/L (ref 19–32)
Chloride: 102 mEq/L (ref 96–112)
Creat: 0.76 mg/dL (ref 0.50–1.10)
Glucose, Bld: 118 mg/dL — ABNORMAL HIGH (ref 70–99)
Potassium: 3.5 mEq/L (ref 3.5–5.3)
Sodium: 142 mEq/L (ref 135–145)

## 2014-10-28 LAB — HEPATIC FUNCTION PANEL
ALT: 14 U/L (ref 0–35)
AST: 19 U/L (ref 0–37)
Albumin: 4.2 g/dL (ref 3.5–5.2)
Alkaline Phosphatase: 62 U/L (ref 39–117)
Bilirubin, Direct: 0.2 mg/dL (ref 0.0–0.3)
Indirect Bilirubin: 0.6 mg/dL (ref 0.2–1.2)
TOTAL PROTEIN: 6.9 g/dL (ref 6.0–8.3)
Total Bilirubin: 0.8 mg/dL (ref 0.2–1.2)

## 2014-10-28 LAB — LIPID PANEL
CHOL/HDL RATIO: 1.9 ratio
Cholesterol: 146 mg/dL (ref 0–200)
HDL: 75 mg/dL (ref >=46)
LDL Cholesterol: 62 mg/dL (ref 0–99)
Triglycerides: 45 mg/dL (ref <150)
VLDL: 9 mg/dL (ref 0–40)

## 2015-01-06 ENCOUNTER — Encounter: Payer: Self-pay | Admitting: Cardiology

## 2015-01-06 ENCOUNTER — Ambulatory Visit (INDEPENDENT_AMBULATORY_CARE_PROVIDER_SITE_OTHER): Payer: BC Managed Care – PPO | Admitting: Cardiology

## 2015-01-06 VITALS — BP 150/74 | HR 72 | Ht 63.0 in | Wt 161.7 lb

## 2015-01-06 DIAGNOSIS — I2510 Atherosclerotic heart disease of native coronary artery without angina pectoris: Secondary | ICD-10-CM | POA: Diagnosis not present

## 2015-01-06 DIAGNOSIS — E785 Hyperlipidemia, unspecified: Secondary | ICD-10-CM

## 2015-01-06 DIAGNOSIS — Z951 Presence of aortocoronary bypass graft: Secondary | ICD-10-CM | POA: Diagnosis not present

## 2015-01-06 DIAGNOSIS — I10 Essential (primary) hypertension: Secondary | ICD-10-CM

## 2015-01-06 NOTE — Progress Notes (Signed)
Kathi Ludwig Date of Birth: 1950-07-20 Medical Record #[id number]  History of Present Illness: Ms. Brotzman is seen for follow up of CAD and HTN.  Has known CAD with remote CABG in 2000. Other issues include HTN and HLD. Negative Myoview June 2013. On follow up today she feels great. Exercising regularly and tries to eat a  healthy diet. No chest pain or SOB. No palpitations. Did not take her medication today. Enjoys traveling and going on cruises.  Current Outpatient Prescriptions  Medication Sig Dispense Refill  . amLODipine (NORVASC) 2.5 MG tablet Take 1 tablet (2.5 mg total) by mouth daily. 180 tablet 3  . aspirin 325 MG tablet Take 325 mg by mouth daily.      [name] Kitchen atorvastatin (LIPITOR) 80 MG tablet Take 1 tablet (80 mg total) by mouth daily. 90 tablet 3  . hydrochlorothiazide (HYDRODIURIL) 25 MG tablet Take 1 tablet (25 mg total) by mouth daily. 90 tablet 3  . metoprolol succinate (TOPROL-XL) 50 MG 24 hr tablet Take 1 tablet (50 mg total) by mouth daily. Take with or immediately following a meal. 90 tablet 3  . Multiple Vitamin (MULTIVITAMIN) capsule Take 1 capsule by mouth daily.    . quinapril (ACCUPRIL) 40 MG tablet Take 1 tablet (40 mg total) by mouth at bedtime. 90 tablet 3   No current facility-administered medications for this visit.    No Known Allergies  Past Medical History  Diagnosis Date  . Coronary disease     past CABG in September 2000 with LIMA to LAD, SVG to 1st DX, SVG to LCX  . HTN (hypertension)   . Hyperlipidemia     Past Surgical History  Procedure Laterality Date  . Coronary artery bypass graft  September 2000    This included an LIMA graft to the LAD, saphenous vein graft to the first diagonal, saphenous vein graft to the circumflex  . Cardiac catheterization  02/26/1999    ef 65%  . Cardiovascular stress test  09/23/2008    EF 71%, NORMAL.    History  Smoking status  . Never Smoker   Smokeless tobacco  . Not on file    History  Alcohol Use No     Family History  Problem Relation Age of Onset  . Heart attack Mother     Review of Systems: The review of systems is per the HPI.  All other systems were reviewed and are negative.  Physical Exam: BP 150/74 mmHg  Pulse 72  Ht 5\' 3"  (1.6 m)  Wt 73.347 kg (161 lb 11.2 oz)  BMI 28.65 kg/m2 Patient is very pleasant and in no acute distress. Skin is warm and dry. Color is normal.  HEENT is unremarkable. Normocephalic/atraumatic. PERRL. Sclera are nonicteric. Neck is supple. No masses. No JVD. Lungs are clear. Cardiac exam shows a regular rate and rhythm. Abdomen is soft. Extremities are without edema. Gait and ROM are intact. No gross neurologic deficits noted.  LABORATORY DATA: Lab Results  Component Value Date   WBC 6.7 03/08/2014   HGB 13.8 03/08/2014   HCT 39.7 03/08/2014   PLT 270 03/08/2014   GLUCOSE 118* 10/28/2014   CHOL 146 10/28/2014   TRIG 45 10/28/2014   HDL 75 10/28/2014   LDLCALC 62 10/28/2014   ALT 14 10/28/2014   AST 19 10/28/2014   NA 142 10/28/2014   K 3.5 10/28/2014   CL 102 10/28/2014   CREATININE 0.76 10/28/2014   BUN 10 10/28/2014   CO2 29 10/28/2014  Assessment / Plan: 1. CAD - remote CABG 16 years ago - negative Myoview in 2013. No symptoms reported. Will update Myoview now.  2. HTN - BP elevated today but did not take her meds today. BP reported under control at home.  3. HLD - excellent control- continue lipitor.  I will follow up in 6 months

## 2015-01-06 NOTE — Patient Instructions (Signed)
We will schedule you for a nuclear stress test  Continue your current therapy  I will see you in 6 months.

## 2015-01-31 ENCOUNTER — Telehealth (HOSPITAL_COMMUNITY): Payer: Self-pay

## 2015-01-31 NOTE — Telephone Encounter (Signed)
Encounter complete. 

## 2015-02-05 ENCOUNTER — Ambulatory Visit (HOSPITAL_COMMUNITY)
Admission: RE | Admit: 2015-02-05 | Discharge: 2015-02-05 | Disposition: A | Discharge status: Home / Self Care | Payer: BC Managed Care – PPO | Source: Ambulatory Visit | Attending: Internal Medicine | Admitting: Internal Medicine

## 2015-02-05 DIAGNOSIS — E785 Hyperlipidemia, unspecified: Secondary | ICD-10-CM

## 2015-02-05 DIAGNOSIS — I10 Essential (primary) hypertension: Secondary | ICD-10-CM

## 2015-02-05 DIAGNOSIS — I2510 Atherosclerotic heart disease of native coronary artery without angina pectoris: Secondary | ICD-10-CM | POA: Diagnosis present

## 2015-02-05 DIAGNOSIS — Z951 Presence of aortocoronary bypass graft: Secondary | ICD-10-CM

## 2015-02-05 LAB — MYOCARDIAL PERFUSION IMAGING
CHL CUP NUCLEAR SRS: 3
CSEPHR: 98 %
Estimated workload: 7.8 METS
Exercise duration (min): 6 min
Exercise duration (sec): 32 s
LV sys vol: 24 mL
LVDIAVOL: 69 mL
MPHR: 156 {beats}/min
Peak HR: 153 {beats}/min
RPE: 16
Rest HR: 67 {beats}/min
SDS: 2
SSS: 4
TID: 1.29

## 2015-02-05 MED ORDER — TECHNETIUM TC 99M SESTAMIBI GENERIC - CARDIOLITE
29.7000 | Freq: Once | INTRAVENOUS | Status: AC | PRN
  Administered 2015-02-05: 30 via INTRAVENOUS

## 2015-02-05 MED ORDER — TECHNETIUM TC 99M SESTAMIBI GENERIC - CARDIOLITE
10.3000 | Freq: Once | INTRAVENOUS | Status: AC | PRN
  Administered 2015-02-05: 10 via INTRAVENOUS

## 2015-02-07 ENCOUNTER — Telehealth: Payer: Self-pay | Admitting: Cardiology

## 2015-02-07 NOTE — Telephone Encounter (Signed)
Patient is returning a call from [facility] for nuclear stress test results.

## 2015-02-07 NOTE — Telephone Encounter (Signed)
Returned call to patient myoview results given. 

## 2015-03-13 ENCOUNTER — Other Ambulatory Visit: Payer: Self-pay | Admitting: Cardiology

## 2015-07-22 ENCOUNTER — Ambulatory Visit (INDEPENDENT_AMBULATORY_CARE_PROVIDER_SITE_OTHER): Payer: BC Managed Care – PPO | Admitting: Cardiology

## 2015-07-22 ENCOUNTER — Encounter: Payer: Self-pay | Admitting: Cardiology

## 2015-07-22 VITALS — BP 120/80 | HR 83 | Ht 63.0 in | Wt 168.0 lb

## 2015-07-22 DIAGNOSIS — Z951 Presence of aortocoronary bypass graft: Secondary | ICD-10-CM

## 2015-07-22 DIAGNOSIS — I10 Essential (primary) hypertension: Secondary | ICD-10-CM

## 2015-07-22 DIAGNOSIS — I2510 Atherosclerotic heart disease of native coronary artery without angina pectoris: Secondary | ICD-10-CM

## 2015-07-22 DIAGNOSIS — E785 Hyperlipidemia, unspecified: Secondary | ICD-10-CM

## 2015-07-22 NOTE — Progress Notes (Signed)
Kathi Ludwig Date of Birth: [birth year] Medical Record [id number]  History of Present Illness: Ms. Strain is seen for follow up of CAD and HTN.  Has known CAD with remote CABG in 2000. Other issues include HTN and HLD. Negative Myoview August 2016. She also has a strong family history of vascular disease. Her sister had an MI age 65 and daughter had a cerebral aneurysm. On follow up today she feels great. Exercising regularly and tries to eat a  healthy diet. No chest pain or SOB. No palpitations. Enjoys traveling.  Current Outpatient Prescriptions  Medication Sig Dispense Refill  . amLODipine (NORVASC) 2.5 MG tablet Take 1 tablet (2.5 mg total) by mouth daily. 180 tablet 3  . aspirin 325 MG tablet Take 325 mg by mouth daily.      [name] Kitchen atorvastatin (LIPITOR) 80 MG tablet Take 1 tablet (80 mg total) by mouth daily. 90 tablet 3  . hydrochlorothiazide (HYDRODIURIL) 25 MG tablet Take 1 tablet (25 mg total) by mouth daily. 90 tablet 3  . metoprolol succinate (TOPROL-XL) 50 MG 24 hr tablet Take 1 tablet (50 mg total) by mouth daily. Take with or immediately following a meal. 90 tablet 3  . Multiple Vitamin (MULTIVITAMIN) capsule Take 1 capsule by mouth daily.    . quinapril (ACCUPRIL) 40 MG tablet TAKE 1 TABLET BY MOUTH EVERY NIGHT AT BEDTIME 90 tablet 1   No current facility-administered medications for this visit.    No Known Allergies  Past Medical History  Diagnosis Date  . Coronary disease     past CABG in September 2000 with LIMA to LAD, SVG to 1st DX, SVG to LCX  . HTN (hypertension)   . Hyperlipidemia     Past Surgical History  Procedure Laterality Date  . Coronary artery bypass graft  September 2000    This included an LIMA graft to the LAD, saphenous vein graft to the first diagonal, saphenous vein graft to the circumflex  . Cardiac catheterization  02/26/1999    ef 65%  . Cardiovascular stress test  09/23/2008    EF 71%, NORMAL.    History  Smoking status  . Never Smoker     Smokeless tobacco  . Not on file    History  Alcohol Use No    Family History  Problem Relation Age of Onset  . Heart attack Mother   . Heart attack Sister 74    MI  . Other Daughter     cerebral aneurysm    Review of Systems: The review of systems is per the HPI.  All other systems were reviewed and are negative.  Physical Exam: BP 120/80 mmHg  Pulse 83  Ht 5\' 3"  (1.6 m)  Wt 76.204 kg (168 lb)  BMI 29.77 kg/m2 Patient is very pleasant and in no acute distress. Skin is warm and dry. Color is normal.  HEENT is unremarkable. Normocephalic/atraumatic. PERRL. Sclera are nonicteric. Neck is supple. No masses. No JVD. Lungs are clear. Cardiac exam shows a regular rate and rhythm. Abdomen is soft. Extremities are without edema. Gait and ROM are intact. No gross neurologic deficits noted.  LABORATORY DATA: Lab Results  Component Value Date   WBC 6.7 03/08/2014   HGB 13.8 03/08/2014   HCT 39.7 03/08/2014   PLT 270 03/08/2014   GLUCOSE 118* 10/28/2014   CHOL 146 10/28/2014   TRIG 45 10/28/2014   HDL 75 10/28/2014   LDLCALC 62 10/28/2014   ALT 14 10/28/2014   AST 19  10/28/2014   NA 142 10/28/2014   K 3.5 10/28/2014   CL 102 10/28/2014   CREATININE 0.76 10/28/2014   BUN 10 10/28/2014   CO2 29 10/28/2014   Ecg today shows NSR with nonspecific ST-T changes. I have personally reviewed and interpreted this study.  Myoview: 02/05/15: Study Highlights     The left ventricular ejection fraction is normal (55-65%).  Nuclear stress EF: 65%.  Blood pressure demonstrated a hypertensive response to exercise.  Downsloping ST segment depression ST segment depression of 3 mm was noted during stress in the II, III, aVF, V5, V6 and V4 leads, beginning at 2 minutes of stress, and returning to baseline after 1-5 minutes of recovery.  Defect 1: There is a small defect of mild severity.  This is a low risk study.  Low risk study. Small, mild fixed inferoapical and apical lateral  defect which is likely artifact. No reversible ischemia. LVEF 65% with normal wall motion. There was significant 3 mm downsloping ST depression inferiorly and laterally on the stress EKG with exercise, however, there is voltage criteria for LVH, baseline wander is noted and this could represent repolarization abnormality or ischemia. Exercise tolerance was fair with no chest pain. There was a marked hypertensive response to exercise.     Assessment / Plan: 1. CAD - remote CABG 16 years ago - negative Myoview in August 2016. No symptoms reported. Continue current medical therapy.  2. HTN - well controlled.  3. HLD - excellent control- continue lipitor. Will repeat fasting lab work with next visit.  I will follow up in 6 months

## 2015-07-22 NOTE — Patient Instructions (Signed)
Continue your current therapy  I will see you in 6 months with fasting lab work.   

## 2015-07-22 NOTE — Addendum Note (Signed)
Addended by: Golden Hurter D on: 07/22/2015 01:20 PM   Modules accepted: Orders

## 2015-08-09 ENCOUNTER — Other Ambulatory Visit: Payer: Self-pay | Admitting: Cardiology

## 2015-08-11 NOTE — Telephone Encounter (Signed)
Rx request sent to pharmacy.  

## 2015-08-25 ENCOUNTER — Other Ambulatory Visit: Payer: Self-pay | Admitting: Cardiology

## 2015-08-25 NOTE — Telephone Encounter (Signed)
REFILL 

## 2015-09-19 ENCOUNTER — Other Ambulatory Visit: Payer: Self-pay | Admitting: Cardiology

## 2015-09-19 NOTE — Telephone Encounter (Signed)
Rx request sent to pharmacy.  

## 2015-10-01 ENCOUNTER — Other Ambulatory Visit: Payer: Self-pay | Admitting: Cardiology

## 2015-10-01 NOTE — Telephone Encounter (Signed)
Rx(s) sent to pharmacy electronically.  

## 2015-12-15 ENCOUNTER — Other Ambulatory Visit: Payer: Self-pay | Admitting: Cardiology

## 2016-01-20 ENCOUNTER — Encounter: Payer: Self-pay | Admitting: Internal Medicine

## 2016-02-05 ENCOUNTER — Other Ambulatory Visit: Payer: Self-pay | Admitting: Cardiology

## 2016-02-25 ENCOUNTER — Other Ambulatory Visit: Payer: Self-pay | Admitting: Cardiology

## 2016-02-26 ENCOUNTER — Telehealth: Payer: Self-pay | Admitting: *Deleted

## 2016-02-26 ENCOUNTER — Other Ambulatory Visit: Payer: Self-pay | Admitting: *Deleted

## 2016-02-26 DIAGNOSIS — Z951 Presence of aortocoronary bypass graft: Secondary | ICD-10-CM

## 2016-02-26 DIAGNOSIS — I2510 Atherosclerotic heart disease of native coronary artery without angina pectoris: Secondary | ICD-10-CM

## 2016-02-26 DIAGNOSIS — E785 Hyperlipidemia, unspecified: Secondary | ICD-10-CM

## 2016-02-26 DIAGNOSIS — I10 Essential (primary) hypertension: Secondary | ICD-10-CM

## 2016-02-26 NOTE — Telephone Encounter (Signed)
Received call from [place] patient came to have labs drawn but orders were placed >64months ago and patient was not fasting.  Pt is returning in the AM for lab work but new orders are needed.  New orders placed.

## 2016-02-27 LAB — BASIC METABOLIC PANEL
BUN: 5 mg/dL — AB (ref 7–25)
CALCIUM: 8.8 mg/dL (ref 8.6–10.4)
CO2: 28 mmol/L (ref 20–31)
Chloride: 103 mmol/L (ref 98–110)
Creat: 0.76 mg/dL (ref 0.50–0.99)
GLUCOSE: 125 mg/dL — AB (ref 65–99)
Potassium: 3.7 mmol/L (ref 3.5–5.3)
SODIUM: 140 mmol/L (ref 135–146)

## 2016-02-27 LAB — HEPATIC FUNCTION PANEL
ALT: 11 U/L (ref 6–29)
AST: 19 U/L (ref 10–35)
Albumin: 3.9 g/dL (ref 3.6–5.1)
Alkaline Phosphatase: 60 U/L (ref 33–130)
BILIRUBIN DIRECT: 0.1 mg/dL (ref <=0.2)
BILIRUBIN TOTAL: 0.4 mg/dL (ref 0.2–1.2)
Indirect Bilirubin: 0.3 mg/dL (ref 0.2–1.2)
Total Protein: 6.3 g/dL (ref 6.1–8.1)

## 2016-02-27 LAB — LIPID PANEL
CHOL/HDL RATIO: 1.8 ratio (ref <=5.0)
Cholesterol: 130 mg/dL (ref 125–200)
HDL: 74 mg/dL (ref >=46)
LDL Cholesterol: 47 mg/dL (ref <130)
Triglycerides: 45 mg/dL (ref <150)
VLDL: 9 mg/dL (ref <30)

## 2016-03-13 ENCOUNTER — Other Ambulatory Visit: Payer: Self-pay | Admitting: Cardiology

## 2016-03-18 ENCOUNTER — Encounter: Payer: Self-pay | Admitting: Cardiology

## 2016-03-18 ENCOUNTER — Ambulatory Visit (INDEPENDENT_AMBULATORY_CARE_PROVIDER_SITE_OTHER): Payer: Medicare Other | Admitting: Cardiology

## 2016-03-18 VITALS — BP 158/66 | HR 85 | Ht 63.0 in | Wt 162.6 lb

## 2016-03-18 DIAGNOSIS — I10 Essential (primary) hypertension: Secondary | ICD-10-CM | POA: Diagnosis not present

## 2016-03-18 DIAGNOSIS — I2510 Atherosclerotic heart disease of native coronary artery without angina pectoris: Secondary | ICD-10-CM | POA: Diagnosis not present

## 2016-03-18 DIAGNOSIS — Z951 Presence of aortocoronary bypass graft: Secondary | ICD-10-CM | POA: Diagnosis not present

## 2016-03-18 DIAGNOSIS — E785 Hyperlipidemia, unspecified: Secondary | ICD-10-CM | POA: Diagnosis not present

## 2016-03-18 NOTE — Progress Notes (Signed)
Krista Woods Date of Birth: 03/18/1951 Medical Record [id number]  History of Present Illness: Krista Woods is seen for follow up of CAD and HTN.  Has known CAD with remote CABG in 2000. Other issues include HTN and HLD. Negative Myoview August 2016. She also has a strong family history of vascular disease. Her sister had an MI age 65 and daughter had a cerebral aneurysm. On follow up today she feels great. Exercising regularly and tries to eat a  healthy diet. She has lost 6 lbs. No chest pain or SOB. No palpitations. Thinks BP elevated now since she drank a [employer]epsi. BP this am at home was [id number] systolic.  Current Outpatient Prescriptions  Medication Sig Dispense Refill  . amLODipine (NORVASC) 2.5 MG tablet TAKE 1 TABLET BY MOUTH ONCE DAILY 180 tablet 0  . aspirin 325 MG tablet Take 325 mg by mouth daily.      [name] Kitchen atorvastatin (LIPITOR) 80 MG tablet TAKE 1 TABLET BY MOUTH EVERY DAY 90 tablet 0  . hydrochlorothiazide (HYDRODIURIL) 25 MG tablet TAKE 1 TABLET BY MOUTH EVERY DAY 90 tablet 3  . metoprolol succinate (TOPROL-XL) 50 MG 24 hr tablet TAKE 1 TABLET BY MOUTH EVERY DAY WITH OR IMMEDIATELY FOLLOWING A MEAL 90 tablet 0  . Multiple Vitamin (MULTIVITAMIN) capsule Take 1 capsule by mouth daily.    . quinapril (ACCUPRIL) 40 MG tablet TAKE 1 TABLET BY MOUTH EVERY NIGHT AT BEDTIME 90 tablet 1   No current facility-administered medications for this visit.     No Known Allergies  Past Medical History:  Diagnosis Date  . Coronary disease    past CABG in September 2000 with LIMA to LAD, SVG to 1st DX, SVG to LCX  . HTN (hypertension)   . Hyperlipidemia     Past Surgical History:  Procedure Laterality Date  . CARDIAC CATHETERIZATION  02/26/1999   ef 65%  . CARDIOVASCULAR STRESS TEST  09/23/2008   EF 71%, NORMAL.  [name] Kitchen CORONARY ARTERY BYPASS GRAFT  September 2000   This included an LIMA graft to the LAD, saphenous vein graft to the first diagonal, saphenous vein graft to the circumflex    History    Smoking Status  . Never Smoker  Smokeless Tobacco  . Not on file    History  Alcohol Use No    Family History  Problem Relation Age of Onset  . Heart attack Mother   . Heart attack Sister 81    MI  . Other Daughter     cerebral aneurysm    Review of Systems: The review of systems is per the HPI.  All other systems were reviewed and are negative.  Physical Exam: BP (!) 158/66   Pulse 85   Ht 5\' 3"  (1.6 m)   Wt 162 lb 9.6 oz (73.8 kg)   SpO2 95%   BMI 28.80 kg/m  Patient is very pleasant and in no acute distress. Skin is warm and dry. Color is normal.  HEENT is unremarkable. Normocephalic/atraumatic. PERRL. Sclera are nonicteric. Neck is supple. No masses. No JVD. Lungs are clear. Cardiac exam shows a regular rate and rhythm. Abdomen is soft. Extremities are without edema. Gait and ROM are intact. No gross neurologic deficits noted.  LABORATORY DATA: Lab Results  Component Value Date   WBC 6.7 03/08/2014   HGB 13.8 03/08/2014   HCT 39.7 03/08/2014   PLT 270 03/08/2014   GLUCOSE 125 (H) 02/26/2016   CHOL 130 02/26/2016   TRIG 45 02/26/2016  HDL 74 02/26/2016   LDLCALC 47 02/26/2016   ALT 11 02/26/2016   AST 19 02/26/2016   NA 140 02/26/2016   K 3.7 02/26/2016   CL 103 02/26/2016   CREATININE 0.76 02/26/2016   BUN 5 (L) 02/26/2016   CO2 28 02/26/2016     Myoview: 02/05/15: Study Highlights     The left ventricular ejection fraction is normal (55-65%).  Nuclear stress EF: 65%.  Blood pressure demonstrated a hypertensive response to exercise.  Downsloping ST segment depression ST segment depression of 3 mm was noted during stress in the II, III, aVF, V5, V6 and V4 leads, beginning at 2 minutes of stress, and returning to baseline after 1-5 minutes of recovery.  Defect 1: There is a small defect of mild severity.  This is a low risk study.  Low risk study. Small, mild fixed inferoapical and apical lateral defect which is likely artifact. No  reversible ischemia. LVEF 65% with normal wall motion. There was significant 3 mm downsloping ST depression inferiorly and laterally on the stress EKG with exercise, however, there is voltage criteria for LVH, baseline wander is noted and this could represent repolarization abnormality or ischemia. Exercise tolerance was fair with no chest pain. There was a marked hypertensive response to exercise.     Assessment / Plan: 1. CAD - remote CABG 16 years ago - negative Myoview in August 2016. No symptoms reported. Continue current medical therapy.  2. HTN - well controlled.  3. HLD - excellent control- continue lipitor.   I will follow up in 6 months

## 2016-03-18 NOTE — Patient Instructions (Signed)
Continue your current therapy  I will see you in 6 months.   

## 2016-04-05 ENCOUNTER — Encounter: Payer: Self-pay | Admitting: Gastroenterology

## 2016-05-04 ENCOUNTER — Other Ambulatory Visit: Payer: Self-pay | Admitting: Cardiology

## 2016-05-04 NOTE — Telephone Encounter (Signed)
Rx(s) sent to pharmacy electronically.  

## 2016-05-11 ENCOUNTER — Ambulatory Visit (AMBULATORY_SURGERY_CENTER): Payer: Self-pay

## 2016-05-11 ENCOUNTER — Encounter: Payer: Self-pay | Admitting: Gastroenterology

## 2016-05-11 VITALS — Ht 63.0 in | Wt 164.6 lb

## 2016-05-11 DIAGNOSIS — Z1211 Encounter for screening for malignant neoplasm of colon: Secondary | ICD-10-CM

## 2016-05-11 MED ORDER — NA SULFATE-K SULFATE-MG SULF 17.5-3.13-1.6 GM/177ML PO SOLN: ORAL | 0 refills | Status: DC

## 2016-05-11 NOTE — Progress Notes (Signed)
Per pt, no allergies to soy or egg products.Pt not taking any weight loss meds or using  O2 at home. 

## 2016-05-25 ENCOUNTER — Encounter: Payer: Self-pay | Admitting: Gastroenterology

## 2016-05-25 ENCOUNTER — Ambulatory Visit (AMBULATORY_SURGERY_CENTER): Payer: Medicare Other | Admitting: Gastroenterology

## 2016-05-25 VITALS — BP 150/69 | HR 89 | Temp 97.5°F | Resp 16 | Ht 63.0 in | Wt 164.0 lb

## 2016-05-25 DIAGNOSIS — D125 Benign neoplasm of sigmoid colon: Secondary | ICD-10-CM

## 2016-05-25 DIAGNOSIS — Z1212 Encounter for screening for malignant neoplasm of rectum: Secondary | ICD-10-CM | POA: Diagnosis not present

## 2016-05-25 DIAGNOSIS — Z1211 Encounter for screening for malignant neoplasm of colon: Secondary | ICD-10-CM | POA: Diagnosis not present

## 2016-05-25 MED ORDER — SODIUM CHLORIDE 0.9 % IV SOLN: 500.0000 mL | INTRAVENOUS | Status: DC

## 2016-05-25 NOTE — Progress Notes (Signed)
Called to room to assist during endoscopic procedure.  Patient ID and intended procedure confirmed with present staff. Received instructions for my participation in the procedure from the performing physician.  

## 2016-05-25 NOTE — Op Note (Signed)
[place] Patient Name: Krista Woods Procedure Date: 05/25/2016 9:33 AM MRN: [id number] Endoscopist: Mauri Pole , MD Age: 65 Referring MD:  Date of Birth: [birth year] Gender: Female Account #: [id number] Procedure:                Colonoscopy Indications:              Screening for colorectal malignant neoplasm, This                            is the patient's first colonoscopy Medicines:                Monitored Anesthesia Care Procedure:                Pre-Anesthesia Assessment:                           - Prior to the procedure, a History and Physical                            was performed, and patient medications and                            allergies were reviewed. The patient's tolerance of                            previous anesthesia was also reviewed. The risks                            and benefits of the procedure and the sedation                            options and risks were discussed with the patient.                            All questions were answered, and informed consent                            was obtained. Prior Anticoagulants: The patient has                            taken no previous anticoagulant or antiplatelet                            agents. ASA Grade Assessment: II - A patient with                            mild systemic disease. After reviewing the risks                            and benefits, the patient was deemed in                            satisfactory condition to undergo the procedure.  After obtaining informed consent, the colonoscope                            was passed under direct vision. Throughout the                            procedure, the patient's blood pressure, pulse, and                            oxygen saturations were monitored continuously. The                            Model CF-HQ190L [id number]) scope was introduced                            through the anus and  advanced to the the cecum,                            identified by appendiceal orifice and ileocecal                            valve. The colonoscopy was performed without                            difficulty. The patient tolerated the procedure                            well. The quality of the bowel preparation was                            excellent. The ileocecal valve, appendiceal                            orifice, and rectum were photographed. Scope In: 9:36:29 AM Scope Out: 9:54:19 AM Scope Withdrawal Time: 0 hours 12 minutes 45 seconds  Total Procedure Duration: 0 hours 17 minutes 50 seconds  Findings:                 The perianal and digital rectal examinations were                            normal.                           A 9 mm polyp was found in the sigmoid colon. The                            polyp was sessile. The polyp was removed with a                            cold snare. Resection and retrieval were complete.                           Multiple small and large-mouthed diverticula were  found in the entire colon.                           The exam was otherwise without abnormality. Complications:            No immediate complications. Estimated Blood Loss:     Estimated blood loss was minimal. Impression:               - One 9 mm polyp in the sigmoid colon, removed with                            a cold snare. Resected and retrieved.                           - Diverticulosis in the entire examined colon.                           - The examination was otherwise normal. Recommendation:           - Patient has a contact number available for                            emergencies. The signs and symptoms of potential                            delayed complications were discussed with the                            patient. Return to normal activities tomorrow.                            Written discharge instructions were provided to  the                            patient.                           - Resume previous diet.                           - Continue present medications.                           - Await pathology results.                           - Repeat colonoscopy in 5 years for surveillance                            based on pathology results. Mauri Pole, MD 05/25/2016 10:03:19 AM This report has been signed electronically.

## 2016-05-25 NOTE — Patient Instructions (Signed)
YOU HAD AN ENDOSCOPIC PROCEDURE TODAY AT THE [facility]:   Refer to the procedure report that was given to you for any specific questions about what was found during the examination.  If the procedure report does not answer your questions, please call your gastroenterologist to clarify.  If you requested that your care partner not be given the details of your procedure findings, then the procedure report has been included in a sealed envelope for you to review at your convenience later.  YOU SHOULD EXPECT: Some feelings of bloating in the abdomen. Passage of more gas than usual.  Walking can help get rid of the air that was put into your GI tract during the procedure and reduce the bloating. If you had a lower endoscopy (such as a colonoscopy or flexible sigmoidoscopy) you may notice spotting of blood in your stool or on the toilet paper. If you underwent a bowel prep for your procedure, you may not have a normal bowel movement for a few days.  Please Note:  You might notice some irritation and congestion in your nose or some drainage.  This is from the oxygen used during your procedure.  There is no need for concern and it should clear up in a day or so.  SYMPTOMS TO REPORT IMMEDIATELY:   Following lower endoscopy (colonoscopy or flexible sigmoidoscopy):  Excessive amounts of blood in the stool  Significant tenderness or worsening of abdominal pains  Swelling of the abdomen that is new, acute  Fever of 100F or higher   Following upper endoscopy (EGD)  Vomiting of blood or coffee ground material  New chest pain or pain under the shoulder blades  Painful or persistently difficult swallowing  New shortness of breath  Fever of 100F or higher  Black, tarry-looking stools  For urgent or emergent issues, a gastroenterologist can be reached at any hour by calling [phone].   DIET:  We do recommend a small meal at first, but then you may proceed to your regular diet.  Drink  plenty of fluids but you should avoid alcoholic beverages for 24 hours.  ACTIVITY:  You should plan to take it easy for the rest of today and you should NOT DRIVE or use heavy machinery until tomorrow (because of the sedation medicines used during the test).    FOLLOW UP: Our staff will call the number listed on your records the next business day following your procedure to check on you and address any questions or concerns that you may have regarding the information given to you following your procedure. If we do not reach you, we will leave a message.  However, if you are feeling well and you are not experiencing any problems, there is no need to return our call.  We will assume that you have returned to your regular daily activities without incident.  If any biopsies were taken you will be contacted by phone or by letter within the next 1-3 weeks.  Please call us at [phone] if you have not heard about the biopsies in 3 weeks.    SIGNATURES/CONFIDENTIALITY: You and/or your care partner have signed paperwork which will be entered into your electronic medical record.  These signatures attest to the fact that that the information above on your After Visit Summary has been reviewed and is understood.  Full responsibility of the confidentiality of this discharge information lies with you and/or your care-partner.  Polyp, diverticulosis and high fiber diet information given. 

## 2016-05-25 NOTE — Progress Notes (Signed)
Report to PACU, RN, vss, BBS= Clear.  

## 2016-05-26 ENCOUNTER — Other Ambulatory Visit: Payer: Self-pay | Admitting: Cardiology

## 2016-05-26 ENCOUNTER — Telehealth: Payer: Self-pay | Admitting: *Deleted

## 2016-05-26 NOTE — Telephone Encounter (Signed)
  Follow up Call-  Call back number 05/25/2016  Post procedure Call Back phone  # [phone]  Permission to leave phone message Yes  Some recent data might be hidden     Patient questions:  Do you have a fever, pain , or abdominal swelling? No. Pain Score  0 *  Have you tolerated food without any problems? yes  Have you been able to return to your normal activities? Yes.    Do you have any questions about your discharge instructions: Diet   No. Medications  No. Follow up visit  No.  Do you have questions or concerns about your Care? No.  Actions: * If pain score is 4 or above: No action needed, pain <4.

## 2016-05-31 ENCOUNTER — Encounter: Payer: Self-pay | Admitting: Gastroenterology

## 2016-06-10 ENCOUNTER — Other Ambulatory Visit: Payer: Self-pay | Admitting: Cardiology

## 2016-08-25 ENCOUNTER — Other Ambulatory Visit: Payer: Self-pay | Admitting: Cardiology

## 2016-09-15 ENCOUNTER — Encounter: Payer: Self-pay | Admitting: *Deleted

## 2016-09-19 ENCOUNTER — Other Ambulatory Visit: Payer: Self-pay | Admitting: Cardiology

## 2016-09-22 ENCOUNTER — Other Ambulatory Visit: Payer: Self-pay | Admitting: Cardiology

## 2016-09-25 NOTE — Progress Notes (Signed)
Krista Woods Date of Birth: [birth year] Medical Record #[id number]  History of Present Illness: Krista Woods is seen for follow up of CAD and HTN.  Has known CAD with remote CABG in 2000. Other issues include HTN and HLD. Negative Myoview August 2016. She also has a strong family history of vascular disease. Her sister and 2 brothers have had an MI and daughter had a cerebral aneurysm. Another sister had a major stroke this year.  On follow up today she feels great. Exercising regularly and tries to eat a  healthy diet. Walks 15K steps daily. No chest pain or SOB. No palpitations.   Current Outpatient Prescriptions  Medication Sig Dispense Refill  . amLODipine (NORVASC) 2.5 MG tablet TAKE 1 TABLET BY MOUTH ONCE DAILY. 30 tablet 0  . aspirin 325 MG tablet Take 325 mg by mouth daily.      [name] Kitchen atorvastatin (LIPITOR) 80 MG tablet TAKE 1 TABLET BY MOUTH EVERY DAY 90 tablet 3  . cholecalciferol (VITAMIN D) 1000 units tablet Take 1,000 Units by mouth daily.    . hydrochlorothiazide (HYDRODIURIL) 25 MG tablet TAKE 1 TABLET BY MOUTH EVERY DAY 90 tablet 1  . metoprolol succinate (TOPROL-XL) 50 MG 24 hr tablet TAKE 1 TABLET BY MOUTH EVERY DAY WITH OR IMMEDIATELY FOLLOWING A MEAL 90 tablet 3  . Multiple Vitamin (MULTIVITAMIN) capsule Take 1 capsule by mouth daily.    . quinapril (ACCUPRIL) 40 MG tablet TAKE 1 TABLET BY MOUTH EVERY NIGHT AT BEDTIME 90 tablet 2   Current Facility-Administered Medications  Medication Dose Route Frequency Provider Last Rate Last Dose  . 0.9 %  sodium chloride infusion  500 mL Intravenous Continuous Mauri Pole, MD        No Known Allergies  Past Medical History:  Diagnosis Date  . Coronary disease    past CABG in September 2000 with LIMA to LAD, SVG to 1st DX, SVG to LCX  . HTN (hypertension)   . Hyperlipidemia   . Myocardial infarction 02/1999   triple bypass surg    Past Surgical History:  Procedure Laterality Date  . CARDIAC CATHETERIZATION  02/26/1999   ef  65%  . CARDIOVASCULAR STRESS TEST  09/23/2008   EF 71%, NORMAL.  [name] Kitchen CORONARY ARTERY BYPASS GRAFT  September 2000   This included an LIMA graft to the LAD, saphenous vein graft to the first diagonal, saphenous vein graft to the circumflex    History  Smoking Status  . Never Smoker  Smokeless Tobacco  . Never Used    History  Alcohol Use No    Family History  Problem Relation Age of Onset  . Heart attack Mother 40  . Heart attack Sister 18    MI  . Cerebral aneurysm Daughter   . Diabetes Brother   . Heart disease Brother   . Diabetes Brother     Review of Systems: The review of systems is per the HPI.  All other systems were reviewed and are negative.  Physical Exam: BP 138/84   Pulse 76   Ht 5\' 3"  (1.6 m)   Wt 162 lb (73.5 kg)   BMI 28.70 kg/m  Patient is very pleasant and in no acute distress. Skin is warm and dry. Color is normal.  HEENT is unremarkable. Normocephalic/atraumatic. PERRL. Sclera are nonicteric. Neck is supple. No masses. No JVD. Lungs are clear. Cardiac exam shows a regular rate and rhythm. Abdomen is soft. Extremities are without edema. Gait and ROM are intact. No  gross neurologic deficits noted.  LABORATORY DATA: Lab Results  Component Value Date   WBC 6.7 03/08/2014   HGB 13.8 03/08/2014   HCT 39.7 03/08/2014   PLT 270 03/08/2014   GLUCOSE 125 (H) 02/26/2016   CHOL 130 02/26/2016   TRIG 45 02/26/2016   HDL 74 02/26/2016   LDLCALC 47 02/26/2016   ALT 11 02/26/2016   AST 19 02/26/2016   NA 140 02/26/2016   K 3.7 02/26/2016   CL 103 02/26/2016   CREATININE 0.76 02/26/2016   BUN 5 (L) 02/26/2016   CO2 28 02/26/2016     Myoview: 02/05/15: Study Highlights     The left ventricular ejection fraction is normal (55-65%).  Nuclear stress EF: 65%.  Blood pressure demonstrated a hypertensive response to exercise.  Downsloping ST segment depression ST segment depression of 3 mm was noted during stress in the II, III, aVF, V5, V6 and V4  leads, beginning at 2 minutes of stress, and returning to baseline after 1-5 minutes of recovery.  Defect 1: There is a small defect of mild severity.  This is a low risk study.  Low risk study. Small, mild fixed inferoapical and apical lateral defect which is likely artifact. No reversible ischemia. LVEF 65% with normal wall motion. There was significant 3 mm downsloping ST depression inferiorly and laterally on the stress EKG with exercise, however, there is voltage criteria for LVH, baseline wander is noted and this could represent repolarization abnormality or ischemia. Exercise tolerance was fair with no chest pain. There was a marked hypertensive response to exercise.    Ecg today shows NSR with nonspecific TWA. I have personally reviewed and interpreted this study.  Assessment / Plan: 1. CAD - remote CABG 16 years ago - negative Myoview in August 2016. No symptoms reported. Continue current medical therapy.  2. HTN - well controlled.  3. HLD - excellent control- continue lipitor.   I will follow up in 6 months with fasting lab work.

## 2016-09-27 ENCOUNTER — Encounter: Payer: Self-pay | Admitting: Cardiology

## 2016-09-27 ENCOUNTER — Ambulatory Visit (INDEPENDENT_AMBULATORY_CARE_PROVIDER_SITE_OTHER): Payer: Medicare Other | Admitting: Cardiology

## 2016-09-27 VITALS — BP 138/84 | HR 76 | Ht 63.0 in | Wt 162.0 lb

## 2016-09-27 DIAGNOSIS — Z951 Presence of aortocoronary bypass graft: Secondary | ICD-10-CM

## 2016-09-27 DIAGNOSIS — I2510 Atherosclerotic heart disease of native coronary artery without angina pectoris: Secondary | ICD-10-CM | POA: Diagnosis not present

## 2016-09-27 DIAGNOSIS — Z79899 Other long term (current) drug therapy: Secondary | ICD-10-CM | POA: Diagnosis not present

## 2016-09-27 DIAGNOSIS — E7800 Pure hypercholesterolemia, unspecified: Secondary | ICD-10-CM | POA: Diagnosis not present

## 2016-09-27 DIAGNOSIS — I10 Essential (primary) hypertension: Secondary | ICD-10-CM

## 2016-09-27 NOTE — Patient Instructions (Signed)
Continue your current therapy  I will see you in 6 months.   

## 2016-09-27 NOTE — Addendum Note (Signed)
Addended by: Ricci Barker on: 09/27/2016 09:56 AM   Modules accepted: Orders

## 2016-11-03 ENCOUNTER — Other Ambulatory Visit: Payer: Self-pay | Admitting: *Deleted

## 2016-11-03 MED ORDER — AMLODIPINE BESYLATE 2.5 MG PO TABS: 2.5000 mg | ORAL_TABLET | Freq: Every day | ORAL | 0 refills | Status: DC

## 2017-01-20 ENCOUNTER — Telehealth: Payer: Self-pay | Admitting: Cardiology

## 2017-01-20 NOTE — Telephone Encounter (Signed)
New message  Pt call requesting to speak with RN. Pt would like to know if she needs to have lab work completed before Dr. Martinique appt on 10/8. Please call back to discuss

## 2017-01-20 NOTE — Telephone Encounter (Signed)
Patient needs fasting labs per MD note prior to appt. Lipid + CMET order printed and mailed to patient

## 2017-01-28 LAB — COMPREHENSIVE METABOLIC PANEL
ALK PHOS: 69 IU/L (ref 39–117)
ALT: 13 IU/L (ref 0–32)
AST: 15 IU/L (ref 0–40)
Albumin/Globulin Ratio: 1.8 (ref 1.2–2.2)
Albumin: 4.1 g/dL (ref 3.6–4.8)
BUN / CREAT RATIO: 17 (ref 12–28)
BUN: 13 mg/dL (ref 8–27)
Bilirubin Total: 0.4 mg/dL (ref 0.0–1.2)
CHLORIDE: 98 mmol/L (ref 96–106)
CO2: 28 mmol/L (ref 20–29)
Calcium: 9.2 mg/dL (ref 8.7–10.3)
Creatinine, Ser: 0.78 mg/dL (ref 0.57–1.00)
GFR calc non Af Amer: 79 mL/min/{1.73_m2} (ref >59)
GFR, EST AFRICAN AMERICAN: 92 mL/min/{1.73_m2} (ref >59)
GLUCOSE: 115 mg/dL — AB (ref 65–99)
Globulin, Total: 2.3 g/dL (ref 1.5–4.5)
Potassium: 4 mmol/L (ref 3.5–5.2)
SODIUM: 140 mmol/L (ref 134–144)
Total Protein: 6.4 g/dL (ref 6.0–8.5)

## 2017-01-28 LAB — LIPID PANEL
Chol/HDL Ratio: 2 ratio (ref 0.0–4.4)
Cholesterol, Total: 133 mg/dL (ref 100–199)
HDL: 65 mg/dL (ref >39)
LDL Calculated: 60 mg/dL (ref 0–99)
Triglycerides: 40 mg/dL (ref 0–149)
VLDL CHOLESTEROL CAL: 8 mg/dL (ref 5–40)

## 2017-02-01 ENCOUNTER — Telehealth: Payer: Self-pay | Admitting: Cardiology

## 2017-02-01 NOTE — Telephone Encounter (Signed)
Returned call to patient.Lab results given. 

## 2017-02-01 NOTE — Telephone Encounter (Signed)
New message   Pt states someone called her and she is returning the phone call.

## 2017-03-10 ENCOUNTER — Other Ambulatory Visit: Payer: Self-pay | Admitting: Cardiology

## 2017-03-31 NOTE — Progress Notes (Signed)
Krista Woods Date of Birth: Nov 14, 1950 Medical Record #[id number]  History of Present Illness: Krista Woods is seen for follow up of CAD and HTN.  Has known CAD with remote CABG in 2000. Other issues include HTN and HLD. Negative Myoview August 2016. She also has a strong family history of vascular disease. Her sister and 2 brothers have had an MI and daughter had a cerebral aneurysm. Another sister had a major stroke this year.  On follow up today she notes that her BP just shot up. It was 201/105 this past Friday. Saw Dr. Philip Aspen who recommended increasing amlodipine to 5 mg daily. She became scared on Saturday so went to the ED. She states she feels great otherwise. She has changed her diet cutting out "white" foods and lost 10 lbs. Cooking more for herself. Is concerned that her cooking has made her BP go up but she doesn't use sodium in cooking.  Exercising regularly and tries to eat a  healthy diet. Walks 15K steps daily. No chest pain or SOB. No palpitations.   Current Outpatient Prescriptions  Medication Sig Dispense Refill  . amLODipine (NORVASC) 2.5 MG tablet Take 1 tablet (2.5 mg total) by mouth daily. 30 tablet 0  . aspirin 325 MG tablet Take 325 mg by mouth daily.      [name] Kitchen atorvastatin (LIPITOR) 80 MG tablet TAKE 1 TABLET BY MOUTH EVERY DAY 90 tablet 3  . cholecalciferol (VITAMIN D) 1000 units tablet Take 1,000 Units by mouth daily.    . hydrochlorothiazide (HYDRODIURIL) 25 MG tablet TAKE 1 TABLET BY MOUTH EVERY DAY 90 tablet 1  . metoprolol succinate (TOPROL-XL) 50 MG 24 hr tablet TAKE 1 TABLET BY MOUTH EVERY DAY WITH OR IMMEDIATELY FOLLOWING A MEAL 90 tablet 3  . Multiple Vitamin (MULTIVITAMIN) capsule Take 1 capsule by mouth daily.    . quinapril (ACCUPRIL) 40 MG tablet TAKE 1 TABLET BY MOUTH EVERY NIGHT AT BEDTIME. 90 tablet 0   Current Facility-Administered Medications  Medication Dose Route Frequency Provider Last Rate Last Dose  . 0.9 %  sodium chloride infusion  500 mL  Intravenous Continuous Nandigam, Venia Minks, MD        No Known Allergies  Past Medical History:  Diagnosis Date  . Coronary disease    past CABG in September 2000 with LIMA to LAD, SVG to 1st DX, SVG to LCX  . HTN (hypertension)   . Hyperlipidemia   . Myocardial infarction [facility]) 02/1999   triple bypass surg    Past Surgical History:  Procedure Laterality Date  . CARDIAC CATHETERIZATION  02/26/1999   ef 65%  . CARDIOVASCULAR STRESS TEST  09/23/2008   EF 71%, NORMAL.  [name] Kitchen CORONARY ARTERY BYPASS GRAFT  September 2000   This included an LIMA graft to the LAD, saphenous vein graft to the first diagonal, saphenous vein graft to the circumflex    History  Smoking Status  . Never Smoker  Smokeless Tobacco  . Never Used    History  Alcohol Use No    Family History  Problem Relation Age of Onset  . Heart attack Mother 95  . Heart attack Sister 58       MI  . Cerebral aneurysm Daughter   . Diabetes Brother   . Heart disease Brother   . Diabetes Brother     Review of Systems: The review of systems is per the HPI.  All other systems were reviewed and are negative.  Physical Exam: BP (!) 166/87  Pulse 74   Ht 5\' 3"  (1.6 m)   Wt 152 lb 6.4 oz (69.1 kg)   BMI 27.00 kg/m  GENERAL:  Well appearing BF in NAD HEENT:  PERRL, EOMI, sclera are clear. Oropharynx is clear. NECK:  No jugular venous distention, carotid upstroke brisk and symmetric, no bruits, no thyromegaly or adenopathy LUNGS:  Clear to auscultation bilaterally CHEST:  Unremarkable HEART:  RRR,  PMI not displaced or sustained,S1 and S2 within normal limits, no S3, no S4: no clicks, no rubs, no murmurs ABD:  Soft, nontender. BS +, no masses or bruits. No hepatomegaly, no splenomegaly EXT:  2 + pulses throughout, no edema, no cyanosis no clubbing SKIN:  Warm and dry.  No rashes NEURO:  Alert and oriented x 3. Cranial nerves II through XII intact. PSYCH:  Cognitively intact    LABORATORY DATA: Lab Results    Component Value Date   WBC 6.7 03/08/2014   HGB 13.8 03/08/2014   HCT 39.7 03/08/2014   PLT 270 03/08/2014   GLUCOSE 115 (H) 01/28/2017   CHOL 133 01/28/2017   TRIG 40 01/28/2017   HDL 65 01/28/2017   LDLCALC 60 01/28/2017   ALT 13 01/28/2017   AST 15 01/28/2017   NA 140 01/28/2017   K 4.0 01/28/2017   CL 98 01/28/2017   CREATININE 0.78 01/28/2017   BUN 13 01/28/2017   CO2 28 01/28/2017     Myoview: 02/05/15: Study Highlights     The left ventricular ejection fraction is normal (55-65%).  Nuclear stress EF: 65%.  Blood pressure demonstrated a hypertensive response to exercise.  Downsloping ST segment depression ST segment depression of 3 mm was noted during stress in the II, III, aVF, V5, V6 and V4 leads, beginning at 2 minutes of stress, and returning to baseline after 1-5 minutes of recovery.  Defect 1: There is a small defect of mild severity.  This is a low risk study.  Low risk study. Small, mild fixed inferoapical and apical lateral defect which is likely artifact. No reversible ischemia. LVEF 65% with normal wall motion. There was significant 3 mm downsloping ST depression inferiorly and laterally on the stress EKG with exercise, however, there is voltage criteria for LVH, baseline wander is noted and this could represent repolarization abnormality or ischemia. Exercise tolerance was fair with no chest pain. There was a marked hypertensive response to exercise.     Assessment / Plan: 1. CAD - remote CABG 16 years ago - negative Myoview in August 2016. asymptomatic. Continue current medical therapy.  2. HTN - recent spike in BP for no clear reason. Continue sodium restriction. Agree with increase amlodipine and we will monitor her BP closely over next 2-3 weeks. Avoid salt.   3. HLD - excellent control- continue lipitor.   4. ? Prediabetes. BS last 4 years tends to be 115-120. She has made dietary changes with reduction in simple carbs with good weight loss of  10 lbs. Will monitor with diet.   I will follow up in 6 months

## 2017-04-03 ENCOUNTER — Emergency Department (HOSPITAL_COMMUNITY)
Admission: EM | Admit: 2017-04-03 | Discharge: 2017-04-03 | Disposition: A | Discharge status: Home / Self Care | Payer: Medicare Other | Attending: Emergency Medicine | Admitting: Emergency Medicine

## 2017-04-03 ENCOUNTER — Encounter (HOSPITAL_COMMUNITY): Payer: Self-pay | Admitting: Emergency Medicine

## 2017-04-03 DIAGNOSIS — I158 Other secondary hypertension: Secondary | ICD-10-CM | POA: Diagnosis not present

## 2017-04-03 DIAGNOSIS — I2510 Atherosclerotic heart disease of native coronary artery without angina pectoris: Secondary | ICD-10-CM | POA: Insufficient documentation

## 2017-04-03 DIAGNOSIS — I252 Old myocardial infarction: Secondary | ICD-10-CM | POA: Insufficient documentation

## 2017-04-03 DIAGNOSIS — W57XXXA Bitten or stung by nonvenomous insect and other nonvenomous arthropods, initial encounter: Secondary | ICD-10-CM | POA: Insufficient documentation

## 2017-04-03 DIAGNOSIS — I10 Essential (primary) hypertension: Secondary | ICD-10-CM | POA: Diagnosis not present

## 2017-04-03 DIAGNOSIS — Z7982 Long term (current) use of aspirin: Secondary | ICD-10-CM | POA: Insufficient documentation

## 2017-04-03 DIAGNOSIS — Z951 Presence of aortocoronary bypass graft: Secondary | ICD-10-CM | POA: Insufficient documentation

## 2017-04-03 DIAGNOSIS — Z79899 Other long term (current) drug therapy: Secondary | ICD-10-CM | POA: Diagnosis not present

## 2017-04-03 DIAGNOSIS — R21 Rash and other nonspecific skin eruption: Secondary | ICD-10-CM | POA: Insufficient documentation

## 2017-04-03 NOTE — ED Notes (Signed)
Dr. Cardama at bedside at this time.  

## 2017-04-03 NOTE — ED Triage Notes (Addendum)
Pt states HTN, hx of same. Taking medications for this. Saw PCP Friday, has follow up cardiologist appointment Monday. Checked her BP and noted it was 093 systolic. PCP told her to increase a dose of one of her medications. Pt states BP is still high. Denies any symptoms. No headache, no chest pain, no swelling, denies visual disturbances. Denies any accompanying symptoms. Pt concerned that if her bp stays elevate she will have a stroke or heart attack (hx of stroke and heart attack). Pt states she has been on her meds for 18 years. PCP did not obtain any blood work on Friday.  Pt also states "I got bit by mosquitos on my right leg." Bites noted. No swelling or signs of infection noted.

## 2017-04-03 NOTE — ED Provider Notes (Signed)
[facility] DEPT Provider Note   CSN: [id number] Arrival date & time: 04/03/17  1211     History   Chief Complaint Chief Complaint  Patient presents with  . Insect Bite  . Hypertension    HPI Krista Woods is a 66 y.o. female.  HPI  29-year-old female with a history of hypertension, hyperlipidemia,CAD with MI status post CABG who presents to the emergency department with elevated blood pressures. Patient has been noticing elevated blood pressures for the last several days. She was seen by her primary care provider on Friday who noted systolic blood pressure in the 200s. Recommended she take an extra dose of her amlodipine and follow up with her cardiologist, with whom she already has an appointment tomorrow. She believes that her elevated blood pressure might be secondary to her recent diet. States that she's been eating [place]. She denies any headache, visual disturbances, dizziness, focal weakness, chest pain, shortness of breath, difficulty urinating, lower extremity swelling.   In addition patient is reporting mosquito bites to the lower extremities that occurred in the last several days while she was standing outside. No fevers, chills, myalgias.  Past Medical History:  Diagnosis Date  . Coronary disease    past CABG in September 2000 with LIMA to LAD, SVG to 1st DX, SVG to LCX  . HTN (hypertension)   . Hyperlipidemia   . Myocardial infarction [facility]) 02/1999   triple bypass surg    Patient Active Problem List   Diagnosis Date Noted  . S/P CABG (coronary artery bypass graft) 03/08/2014  . Coronary disease   . HTN (hypertension)   . Hyperlipidemia     Past Surgical History:  Procedure Laterality Date  . CARDIAC CATHETERIZATION  02/26/1999   ef 65%  . CARDIOVASCULAR STRESS TEST  09/23/2008   EF 71%, NORMAL.  [name] Kitchen CORONARY ARTERY BYPASS GRAFT  September 2000   This included an LIMA graft to the LAD, saphenous vein graft to the first diagonal, saphenous vein graft to the  circumflex    OB History    No data available       Home Medications    Prior to Admission medications   Medication Sig Start Date End Date Taking? Authorizing Provider  amLODipine (NORVASC) 2.5 MG tablet Take 1 tablet (2.5 mg total) by mouth daily. 11/03/16   Martinique, Peter M, MD  aspirin 325 MG tablet Take 325 mg by mouth daily.      [provider]  atorvastatin (LIPITOR) 80 MG tablet TAKE 1 TABLET BY MOUTH EVERY DAY 05/04/16   Martinique, Peter M, MD  cholecalciferol (VITAMIN D) 1000 units tablet Take 1,000 Units by mouth daily.    [provider]  hydrochlorothiazide (HYDRODIURIL) 25 MG tablet TAKE 1 TABLET BY MOUTH EVERY DAY 09/20/16   Martinique, Peter M, MD  metoprolol succinate (TOPROL-XL) 50 MG 24 hr tablet TAKE 1 TABLET BY MOUTH EVERY DAY WITH OR IMMEDIATELY FOLLOWING A MEAL 05/27/16   Martinique, Peter M, MD  Multiple Vitamin (MULTIVITAMIN) capsule Take 1 capsule by mouth daily.    [provider]  quinapril (ACCUPRIL) 40 MG tablet TAKE 1 TABLET BY MOUTH EVERY NIGHT AT BEDTIME. 03/10/17   Martinique, Peter M, MD    Family History Family History  Problem Relation Age of Onset  . Heart attack Mother 70  . Heart attack Sister 76       MI  . Cerebral aneurysm Daughter   . Diabetes Brother   . Heart disease Brother   .  Diabetes Brother     Social History Social History  Substance Use Topics  . Smoking status: Never Smoker  . Smokeless tobacco: Never Used  . Alcohol use No     Allergies   Patient has no known allergies.   Review of Systems Review of Systems All other systems are reviewed and are negative for acute change except as noted in the HPI   Physical Exam Updated Vital Signs BP (!) 195/90 Pulse 92   Temp 97.7 F (36.5 C) (Oral)   Resp 19   SpO2 100%   Physical Exam  Constitutional: She is oriented to person, place, and time. She appears well-developed and well-nourished. No distress.  HENT:  Head: Normocephalic and atraumatic.    Nose: Nose normal.  Eyes: Pupils are equal, round, and reactive to light. Conjunctivae and EOM are normal. Right eye exhibits no discharge. Left eye exhibits no discharge. No scleral icterus.  Neck: Normal range of motion. Neck supple.  Cardiovascular: Normal rate and regular rhythm.  Exam reveals no gallop and no friction rub.   No murmur heard. Pulmonary/Chest: Effort normal and breath sounds normal. No stridor. No respiratory distress. She has no rales.    Abdominal: Soft. She exhibits no distension. There is no tenderness.  Musculoskeletal: She exhibits no edema or tenderness.       Legs: Neurological: She is alert and oriented to person, place, and time.  Skin: Skin is warm and dry. No rash noted. She is not diaphoretic. No erythema.  Psychiatric: She has a normal mood and affect.  Vitals reviewed.    ED Treatments / Results  Labs (all labs ordered are listed, but only abnormal results are displayed) Labs Reviewed - No data to display  EKG  EKG Interpretation None       Radiology No results found.  Procedures Procedures (including critical care time)  Medications Ordered in ED Medications - No data to display   Initial Impression / Assessment and Plan / ED Course  I have reviewed the triage vital signs and the nursing notes.  Pertinent labs & imaging results that were available during my care of the patient were reviewed by me and considered in my medical decision making (see chart for details).     Asymptomatic hypertension. No indication for workup at this time. Recommended patient continue with her home regimen and take her extra amlodipine dose as directed by her primary care provider. Patient already has follow-up with her cardiologist tomorrow morning. She was instructed to keep this appointment.  Lower extremity mosquito bites without evidence of superimposed infection. Patient does not appear to have a systemic viral process ongoing at this time.  The  patient is safe for discharge with strict return precautions.   Final Clinical Impressions(s) / ED Diagnoses   Final diagnoses:  Other secondary hypertension  Insect bite, initial encounter   Disposition: Discharge  Condition: Good  I have discussed the results, Dx and Tx plan with the patient who expressed understanding and agree(s) with the plan. Discharge instructions discussed at great length. The patient was given strict return precautions who verbalized understanding of the instructions. No further questions at time of discharge.    New Prescriptions   No medications on file    Follow Up: Leanna Battles, [place] [id number] [phone]  Schedule an appointment as soon as possible for a visit  As needed  Cardiologist  In 1 day past scheduled for further management of your hypertension  Fatima Blank, MD 04/03/17 1326

## 2017-04-03 NOTE — ED Notes (Signed)
No complaints other than BP elevated. Denies pain/headache. No neuro deficits noted.

## 2017-04-04 ENCOUNTER — Encounter: Payer: Self-pay | Admitting: Cardiology

## 2017-04-04 ENCOUNTER — Ambulatory Visit (INDEPENDENT_AMBULATORY_CARE_PROVIDER_SITE_OTHER): Payer: Medicare Other | Admitting: Cardiology

## 2017-04-04 VITALS — BP 166/87 | HR 74 | Ht 63.0 in | Wt 152.4 lb

## 2017-04-04 DIAGNOSIS — I10 Essential (primary) hypertension: Secondary | ICD-10-CM | POA: Diagnosis not present

## 2017-04-04 DIAGNOSIS — I2510 Atherosclerotic heart disease of native coronary artery without angina pectoris: Secondary | ICD-10-CM | POA: Diagnosis not present

## 2017-04-04 DIAGNOSIS — Z951 Presence of aortocoronary bypass graft: Secondary | ICD-10-CM | POA: Diagnosis not present

## 2017-04-04 DIAGNOSIS — E7800 Pure hypercholesterolemia, unspecified: Secondary | ICD-10-CM | POA: Diagnosis not present

## 2017-04-04 NOTE — Patient Instructions (Signed)
Continue your current therapy and keep a close eye on your blood pressure  I will see you in 6 months.

## 2017-04-07 ENCOUNTER — Other Ambulatory Visit: Payer: Self-pay | Admitting: Cardiology

## 2017-04-27 ENCOUNTER — Other Ambulatory Visit: Payer: Self-pay | Admitting: *Deleted

## 2017-04-27 MED ORDER — AMLODIPINE BESYLATE 2.5 MG PO TABS: 2.5000 mg | ORAL_TABLET | Freq: Every day | ORAL | 6 refills | Status: DC

## 2017-05-18 ENCOUNTER — Other Ambulatory Visit: Payer: Self-pay | Admitting: Cardiology

## 2017-05-18 NOTE — Telephone Encounter (Signed)
REFILL 

## 2017-05-22 ENCOUNTER — Other Ambulatory Visit: Payer: Self-pay | Admitting: Cardiology

## 2017-05-23 ENCOUNTER — Telehealth: Payer: Self-pay | Admitting: Cardiology

## 2017-05-23 MED ORDER — METOPROLOL SUCCINATE ER 50 MG PO TB24: ORAL_TABLET | ORAL | 3 refills | Status: DC

## 2017-05-23 MED ORDER — AMLODIPINE BESYLATE 2.5 MG PO TABS: 2.5000 mg | ORAL_TABLET | Freq: Every day | ORAL | 3 refills | Status: DC

## 2017-05-23 MED ORDER — ATORVASTATIN CALCIUM 80 MG PO TABS: 80.0000 mg | ORAL_TABLET | Freq: Every day | ORAL | 3 refills | Status: DC

## 2017-05-23 NOTE — Telephone Encounter (Signed)
New message     Patient was in April 04 2017  Needs refills    *STAT* If patient is at the pharmacy, call can be transferred to refill team.   1. Which medications need to be refilled? (please list name of each medication and dose if known)  metoprolol succinate (TOPROL-XL) 50 MG 24 hr tablet atorvastatin (LIPITOR) 80 MG tablet amLODipine (NORVASC) 2.5 MG tablet 2. Which pharmacy/location (including street and city if local pharmacy) is medication to be sent to?  [facility]   3. Do they need a 30 day or 90 day  supply? [place]

## 2017-05-23 NOTE — Telephone Encounter (Signed)
Refill sent to the pharmacy electronically.  

## 2017-06-20 ENCOUNTER — Other Ambulatory Visit: Payer: Self-pay | Admitting: Cardiology

## 2017-06-20 NOTE — Telephone Encounter (Signed)
REFILL 

## 2017-06-30 ENCOUNTER — Other Ambulatory Visit: Payer: Self-pay | Admitting: Cardiology

## 2017-12-27 ENCOUNTER — Other Ambulatory Visit: Payer: Self-pay | Admitting: Internal Medicine

## 2017-12-28 ENCOUNTER — Other Ambulatory Visit: Payer: Self-pay | Admitting: Internal Medicine

## 2017-12-28 ENCOUNTER — Other Ambulatory Visit: Payer: Self-pay | Admitting: Cardiology

## 2017-12-28 DIAGNOSIS — N6489 Other specified disorders of breast: Secondary | ICD-10-CM

## 2017-12-28 DIAGNOSIS — Z1231 Encounter for screening mammogram for malignant neoplasm of breast: Secondary | ICD-10-CM

## 2018-01-18 ENCOUNTER — Ambulatory Visit
Admission: RE | Admit: 2018-01-18 | Discharge: 2018-01-18 | Disposition: A | Discharge status: Home / Self Care | Payer: Medicare Other | Source: Ambulatory Visit | Attending: Internal Medicine | Admitting: Internal Medicine

## 2018-01-18 ENCOUNTER — Ambulatory Visit: Payer: Medicare Other

## 2018-01-18 DIAGNOSIS — N6489 Other specified disorders of breast: Secondary | ICD-10-CM

## 2018-02-10 ENCOUNTER — Other Ambulatory Visit: Payer: Self-pay | Admitting: Cardiology

## 2018-04-11 ENCOUNTER — Encounter: Payer: Self-pay | Admitting: Gastroenterology

## 2018-04-11 LAB — IFOBT (OCCULT BLOOD): IMMUNOLOGICAL FECAL OCCULT BLOOD TEST: POSITIVE

## 2018-04-23 ENCOUNTER — Other Ambulatory Visit: Payer: Self-pay | Admitting: Cardiology

## 2018-05-24 ENCOUNTER — Other Ambulatory Visit: Payer: Self-pay | Admitting: Cardiology

## 2018-07-22 ENCOUNTER — Other Ambulatory Visit: Payer: Self-pay | Admitting: Cardiology

## 2018-07-25 ENCOUNTER — Encounter: Payer: Self-pay | Admitting: Gastroenterology

## 2018-07-25 ENCOUNTER — Ambulatory Visit: Payer: Medicare Other | Admitting: Gastroenterology

## 2018-07-25 VITALS — BP 110/70 | HR 68 | Ht 62.0 in | Wt 156.0 lb

## 2018-07-25 DIAGNOSIS — R195 Other fecal abnormalities: Secondary | ICD-10-CM

## 2018-07-25 NOTE — Patient Instructions (Signed)
You have been scheduled for an endoscopy. Please follow written instructions given to you at your visit today. If you use inhalers (even only as needed), please bring them with you on the day of your procedure.   If you are age 68 or older, your body mass index should be between 23-30. Your Body mass index is 28.53 kg/m. If this is out of the aforementioned range listed, please consider follow up with your Primary Care Provider.  If you are age 5 or younger, your body mass index should be between 19-25. Your Body mass index is 28.53 kg/m. If this is out of the aformentioned range listed, please consider follow up with your Primary Care Provider.    I appreciate the  opportunity to care for you  Thank You   Harl Bowie , MD

## 2018-07-25 NOTE — Progress Notes (Signed)
Krista Woods    [id number]    09/08/1950  Primary Care Physician:Paterson, Quillian Quince, MD  Referring Physician: Leanna Battles, MD [address] [place], [place] [address]  Chief complaint:  Heme positive  HPI:  68 year old female with history of hypertension, CAD status post CABG on aspirin 325 mg daily here for evaluation of heme positive stool. Denies any melena or blood per rectum, no overt bleeding.  No active GI issues or symptoms. Denies any nausea, vomiting, abdominal pain, melena or bright red blood per rectum  Reviewed recent labs from Dr. Shon Baton office.  CBC, CMP within normal limits  Colonoscopy November 2017: 9 mm polyp (tubular adenoma) removed from sigmoid colon, pancolonic diverticulosis otherwise normal exam   Outpatient Encounter Medications as of 07/25/2018  Medication Sig  . amLODipine (NORVASC) 2.5 MG tablet Take 1 tablet (2.5 mg total) by mouth daily.  [name] Kitchen aspirin 325 MG tablet Take 325 mg by mouth daily.    [name] Kitchen atorvastatin (LIPITOR) 80 MG tablet Take 1 tablet (80 mg total) by mouth daily. CALL OUR OFFICE FOR AN APPOINTMENT  . cholecalciferol (VITAMIN D) 1000 units tablet Take 1,000 Units by mouth daily.  . hydrochlorothiazide (HYDRODIURIL) 25 MG tablet Take 1 tablet (25 mg total) by mouth daily. CALL OUR OFFICE FOR AN APPOINTMENT  . metoprolol succinate (TOPROL-XL) 50 MG 24 hr tablet Take 1 tablet (50 mg total) by mouth daily. CALL OUR OFFICE FOR AN APPOINTMENT  . Multiple Vitamin (MULTIVITAMIN) capsule Take 1 capsule by mouth daily.  . quinapril (ACCUPRIL) 40 MG tablet Take 1 tablet (40 mg total) by mouth at bedtime. NEED OV.   Facility-Administered Encounter Medications as of 07/25/2018  Medication  . 0.9 %  sodium chloride infusion    Allergies as of 07/25/2018  . (No Known Allergies)    Past Medical History:  Diagnosis Date  . Coronary disease    past CABG in September 2000 with LIMA to LAD, SVG to 1st DX, SVG to LCX  . HTN  (hypertension)   . Hyperlipidemia   . Myocardial infarction [facility]) 02/1999   triple bypass surg    Past Surgical History:  Procedure Laterality Date  . CARDIAC CATHETERIZATION  02/26/1999   ef 65%  . CARDIOVASCULAR STRESS TEST  09/23/2008   EF 71%, NORMAL.  [name] Kitchen CORONARY ARTERY BYPASS GRAFT  September 2000   This included an LIMA graft to the LAD, saphenous vein graft to the first diagonal, saphenous vein graft to the circumflex    Family History  Problem Relation Age of Onset  . Heart attack Mother 55  . Heart attack Sister 39       MI  . Cerebral aneurysm Daughter   . Diabetes Brother   . Heart disease Brother   . Diabetes Brother     Social History   Socioeconomic History  . Marital status: Widowed    Spouse name: Not on file  . Number of children: 2  . Years of education: Not on file  . Highest education level: Not on file  Occupational History  . 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 Financial resource strain: Not on file  . Food insecurity:    Worry: Not on file    Inability: Not on file  . Transportation needs:    Medical: Not on file    Non-medical: Not on file  Tobacco Use  . Smoking status: Never Smoker  .  Smokeless tobacco: Never Used  Substance and Sexual Activity  . Alcohol use: No  . Drug use: No  . Sexual activity: Never  Lifestyle  . Physical activity:    Days per week: Not on file    Minutes per session: Not on file  . Stress: Not on file  Relationships  . Social connections:    Talks on phone: Not on file    Gets together: Not on file    Attends religious service: Not on file    Active member of club or organization: Not on file    Attends meetings of clubs or organizations: Not on file    Relationship status: Not on file  . Intimate partner violence:    Fear of current or ex partner: Not on file    Emotionally abused: Not on file    Physically abused: Not on file    Forced sexual activity: Not on file  Other  Topics Concern  . Not on file  Social History Narrative  . Not on file      Review of systems: Review of Systems  Constitutional: Negative for fever and chills.  HENT: Negative.   Eyes: Negative for blurred vision.  Respiratory: Negative for cough, shortness of breath and wheezing.   Cardiovascular: Negative for chest pain and palpitations.  Gastrointestinal: as per HPI Genitourinary: Negative for dysuria, urgency, frequency and hematuria.  Musculoskeletal: Negative for myalgias, back pain and joint pain.  Skin: Negative for itching and rash.  Neurological: Negative for dizziness, tremors, focal weakness, seizures and loss of consciousness.  Endo/Heme/Allergies: Negative for seasonal allergies.  Psychiatric/Behavioral: Negative for depression, suicidal ideas and hallucinations.  All other systems reviewed and are negative.   Physical Exam: Vitals:   07/25/18 1349  BP: 110/70  Pulse: 68   Body mass index is 28.53 kg/m. Gen:      No acute distress HEENT:  EOMI, sclera anicteric Neck:     No masses; no thyromegaly Lungs:    Clear to auscultation bilaterally; normal respiratory effort CV:         Regular rate and rhythm; no murmurs Abd:      + bowel sounds; soft, non-tender; no palpable masses, no distension Ext:    No edema; adequate peripheral perfusion Skin:      Warm and dry; no rash Neuro: alert and oriented x 3 Psych: normal mood and affect  Data Reviewed:  Reviewed labs, radiology imaging, old records and pertinent past GI work up   Assessment and Plan/Recommendations:  68 year old female with history of CAD status post CABG on 325 mg aspirin daily with heme positive stool.  No overt GI bleeding or symptoms.  No anemia or iron deficiency.  Will schedule for EGD to exclude severe gastritis or peptic ulcer disease Do not recommend ongoing annual fecal Hemoccult as she is up-to-date with colorectal cancer screening Due for surveillance colonoscopy November  2022 The risks and benefits as well as alternatives of endoscopic procedure(s) have been discussed and reviewed. All questions answered. The patient agrees to proceed.   Damaris Hippo , MD [phone]    CC: Leanna Battles, MD

## 2018-07-27 ENCOUNTER — Encounter: Payer: Self-pay | Admitting: Gastroenterology

## 2018-07-28 ENCOUNTER — Other Ambulatory Visit: Payer: Self-pay | Admitting: Gastroenterology

## 2018-07-28 ENCOUNTER — Encounter: Payer: Self-pay | Admitting: Gastroenterology

## 2018-07-28 ENCOUNTER — Ambulatory Visit (AMBULATORY_SURGERY_CENTER): Payer: Medicare Other | Admitting: Gastroenterology

## 2018-07-28 VITALS — BP 155/76 | HR 66 | Temp 96.6°F | Resp 17 | Ht 62.0 in | Wt 156.0 lb

## 2018-07-28 DIAGNOSIS — K449 Diaphragmatic hernia without obstruction or gangrene: Secondary | ICD-10-CM | POA: Diagnosis not present

## 2018-07-28 DIAGNOSIS — K2970 Gastritis, unspecified, without bleeding: Secondary | ICD-10-CM | POA: Diagnosis not present

## 2018-07-28 DIAGNOSIS — R195 Other fecal abnormalities: Secondary | ICD-10-CM

## 2018-07-28 MED ORDER — SODIUM CHLORIDE 0.9 % IV SOLN: 500.0000 mL | INTRAVENOUS | Status: DC

## 2018-07-28 MED ORDER — SUCRALFATE 1 G PO TABS: 1.0000 g | ORAL_TABLET | Freq: Two times a day (BID) | ORAL | 0 refills | Status: DC

## 2018-07-28 MED ORDER — OMEPRAZOLE 40 MG PO CPDR
40.0000 mg | DELAYED_RELEASE_CAPSULE | Freq: Every day | ORAL | 3 refills | Status: DC
Start: 2018-07-28 — End: 2018-08-08

## 2018-07-28 NOTE — Progress Notes (Signed)
Report given to PACU, vss 

## 2018-07-28 NOTE — Progress Notes (Signed)
Called to room to assist during endoscopic procedure.  Patient ID and intended procedure confirmed with present staff. Received instructions for my participation in the procedure from the performing physician.  

## 2018-07-28 NOTE — Patient Instructions (Signed)
Continue present medications. Please read handouts provided. Prilosec (Omeprazole) 40 mg daily for 3 months. Sucralfate 1 gram twice daily for 1 month. Await pathology results.       YOU HAD AN ENDOSCOPIC PROCEDURE TODAY AT [facility]:   Refer to the procedure report that was given to you for any specific questions about what was found during the examination.  If the procedure report does not answer your questions, please call your gastroenterologist to clarify.  If you requested that your care partner not be given the details of your procedure findings, then the procedure report has been included in a sealed envelope for you to review at your convenience later.  YOU SHOULD EXPECT: Some feelings of bloating in the abdomen. Passage of more gas than usual.  Walking can help get rid of the air that was put into your GI tract during the procedure and reduce the bloating. If you had a lower endoscopy (such as a colonoscopy or flexible sigmoidoscopy) you may notice spotting of blood in your stool or on the toilet paper. If you underwent a bowel prep for your procedure, you may not have a normal bowel movement for a few days.  Please Note:  You might notice some irritation and congestion in your nose or some drainage.  This is from the oxygen used during your procedure.  There is no need for concern and it should clear up in a day or so.  SYMPTOMS TO REPORT IMMEDIATELY:    Following upper endoscopy (EGD)  Vomiting of blood or coffee ground material  New chest pain or pain under the shoulder blades  Painful or persistently difficult swallowing  New shortness of breath  Fever of 100F or higher  Black, tarry-looking stools  For urgent or emergent issues, a gastroenterologist can be reached at any hour by calling [phone].   DIET:  We do recommend a small meal at first, but then you may proceed to your regular diet.  Drink plenty of fluids but you should avoid alcoholic  beverages for 24 hours.  ACTIVITY:  You should plan to take it easy for the rest of today and you should NOT DRIVE or use heavy machinery until tomorrow (because of the sedation medicines used during the test).    FOLLOW UP: Our staff will call the number listed on your records the next business day following your procedure to check on you and address any questions or concerns that you may have regarding the information given to you following your procedure. If we do not reach you, we will leave a message.  However, if you are feeling well and you are not experiencing any problems, there is no need to return our call.  We will assume that you have returned to your regular daily activities without incident.  If any biopsies were taken you will be contacted by phone or by letter within the next 1-3 weeks.  Please call us at [phone] if you have not heard about the biopsies in 3 weeks.    SIGNATURES/CONFIDENTIALITY: You and/or your care partner have signed paperwork which will be entered into your electronic medical record.  These signatures attest to the fact that that the information above on your After Visit Summary has been reviewed and is understood.  Full responsibility of the confidentiality of this discharge information lies with you and/or your care-partner.

## 2018-07-28 NOTE — Op Note (Signed)
[facility] Patient Name: Krista Woods Procedure Date: 07/28/2018 2:05 PM MRN: [id number] Endoscopist: Mauri Pole , MD Age: 68 Referring MD:  Date of Birth: [birth year] Gender: Female Account #: [id number] Procedure:                Upper GI endoscopy Indications:              Fecal heme occult positive. Suspected upper                            gastrointestinal bleeding, Gastrointestinal                            bleeding of unknown origin Medicines:                Monitored Anesthesia Care Procedure:                Pre-Anesthesia Assessment:                           - Prior to the procedure, a History and Physical                            was performed, and patient medications and                            allergies were reviewed. The patient's tolerance of                            previous anesthesia was also reviewed. The risks                            and benefits of the procedure and the sedation                            options and risks were discussed with the patient.                            All questions were answered, and informed consent                            was obtained. Prior Anticoagulants: The patient                            last took aspirin on the day of the procedure. ASA                            Grade Assessment: III - A patient with severe                            systemic disease. After reviewing the risks and                            benefits, the patient was deemed in satisfactory  condition to undergo the procedure.                           After obtaining informed consent, the endoscope was                            passed under direct vision. Throughout the                            procedure, the patient's blood pressure, pulse, and                            oxygen saturations were monitored continuously. The                            Model GIF-[id number]) scope was introduced                             through the mouth, and advanced to the second part                            of duodenum. The upper GI endoscopy was                            accomplished without difficulty. The patient                            tolerated the procedure well. Scope In: Scope Out: Findings:                 The Z-line was regular and was found 35 cm from the                            incisors.                           A small hiatal hernia was present.                           Patchy mild inflammation characterized by adherent                            blood, congestion (edema), erosions, granularity                            and deep ulcerations was found on the posterior                            wall of the stomach. Biopsies were taken with a                            cold forceps for histology. Biopsies were taken                            with a cold forceps for Helicobacter pylori  testing.                           The examined duodenum was normal. Complications:            No immediate complications. Estimated Blood Loss:     Estimated blood loss was minimal. Impression:               - Z-line regular, 35 cm from the incisors.                           - Small hiatal hernia.                           - Gastritis. Biopsied.                           - Normal examined duodenum. Recommendation:           - Patient has a contact number available for                            emergencies. The signs and symptoms of potential                            delayed complications were discussed with the                            patient. Return to normal activities tomorrow.                            Written discharge instructions were provided to the                            patient.                           - Resume previous diet.                           - Continue present medications.                           - Continue Aspirin 325mg  daily. Please discuss with                             your cardiologist if ok to switch to Aspirin 81mg                             daily instead of 325mg  daily                           - Await pathology results.                           - Use Prilosec (omeprazole) 40 mg PO daily for 3  months.                           - Use sucralfate tablets 1 gram PO BID for 1 month. Mauri Pole, MD 07/28/2018 2:30:12 PM This report has been signed electronically.

## 2018-07-31 ENCOUNTER — Telehealth: Payer: Self-pay

## 2018-07-31 ENCOUNTER — Other Ambulatory Visit: Payer: Self-pay | Admitting: Cardiology

## 2018-07-31 IMAGING — MG DIGITAL DIAGNOSTIC BILATERAL MAMMOGRAM WITH TOMO AND CAD
6 of 10 series · 6 of 30 positions shown · non-contrast
Comparison: Previous exam(s).

CLINICAL DATA: Greater than 2 year follow-up of a right breast
mass.

EXAM:
DIGITAL DIAGNOSTIC BILATERAL MAMMOGRAM WITH CAD AND TOMO
ULTRASOUND RIGHT BREAST

[R CC synth-2D]
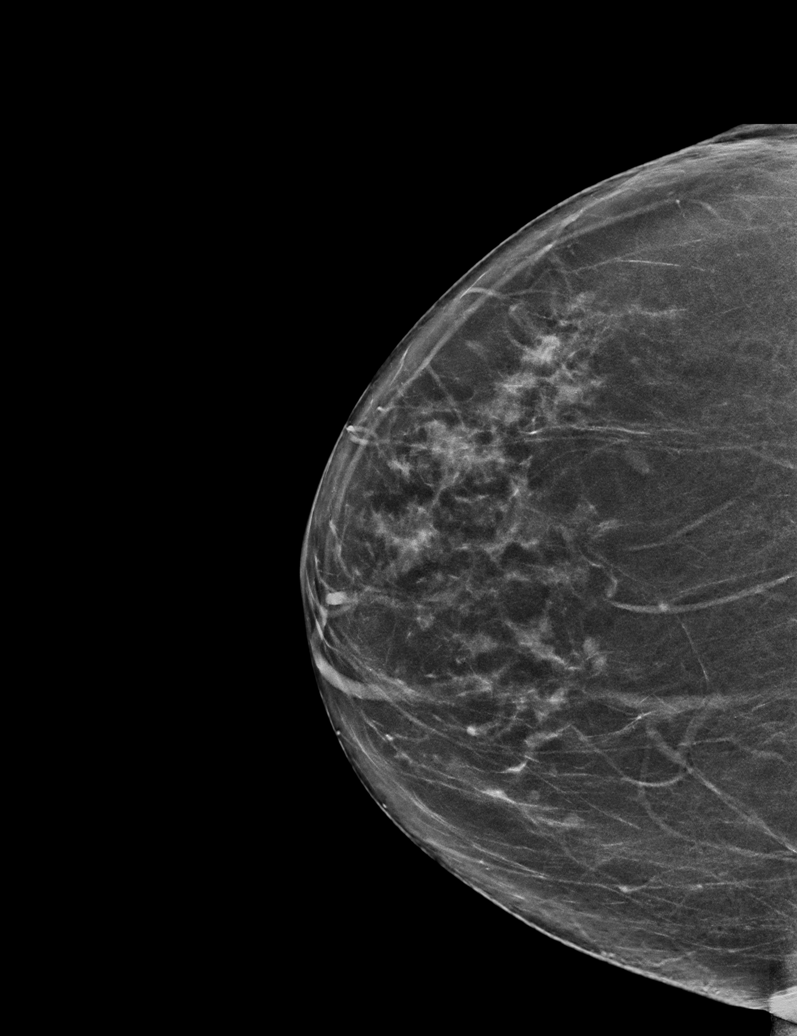

[R MLO synth-2D (1 of 2)]
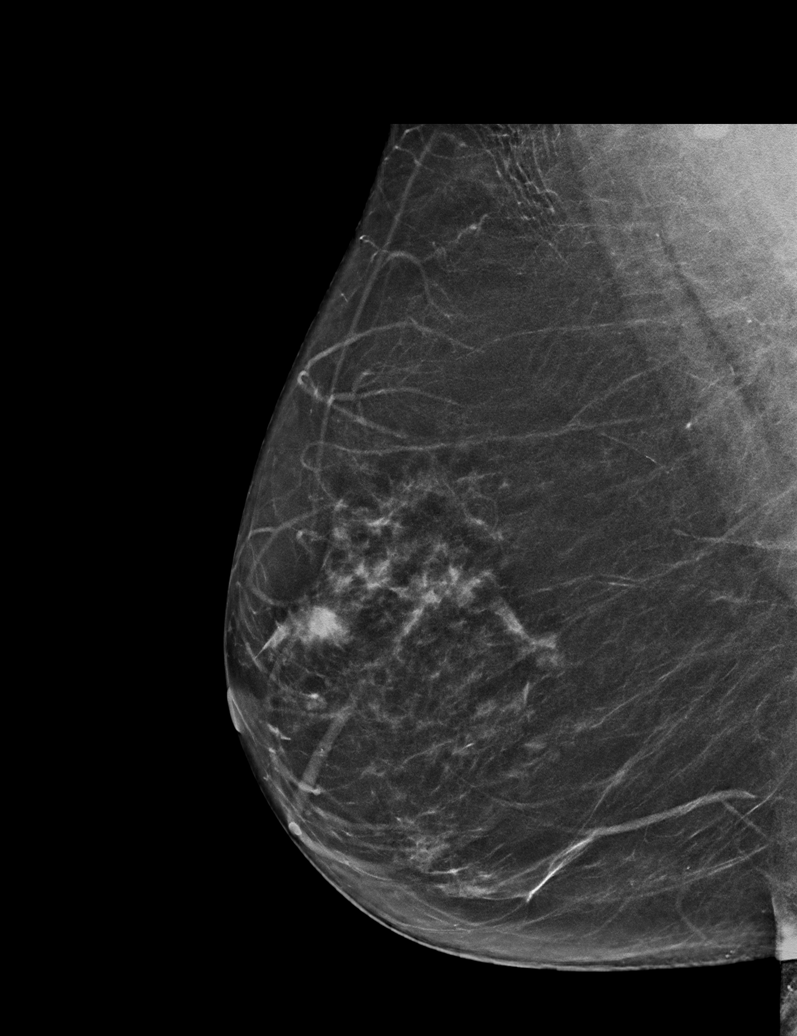

[L CC synth-2D]
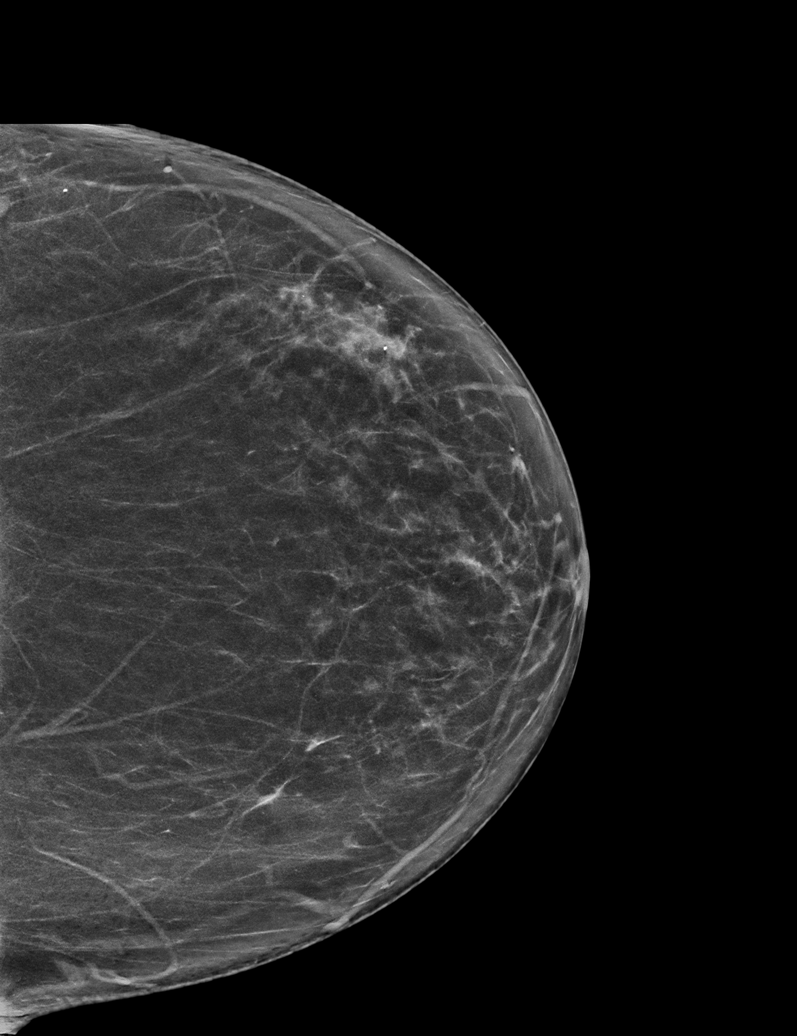

[L MLO synth-2D]
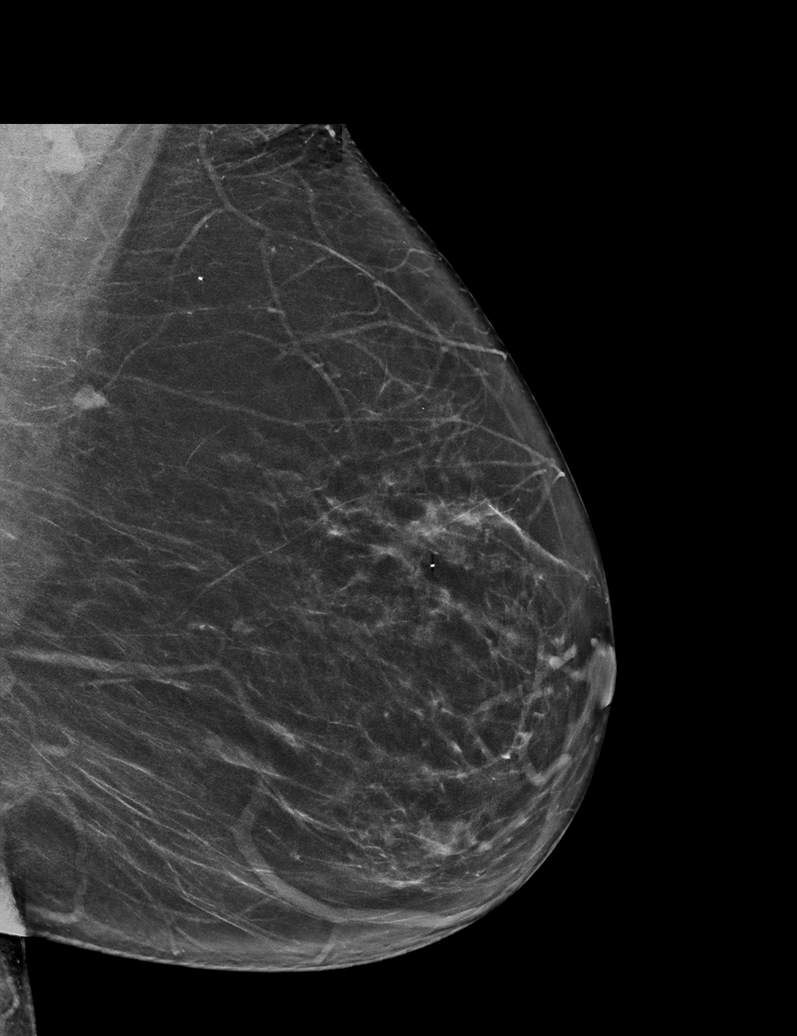

[R MLO synth-2D (2 of 2)]
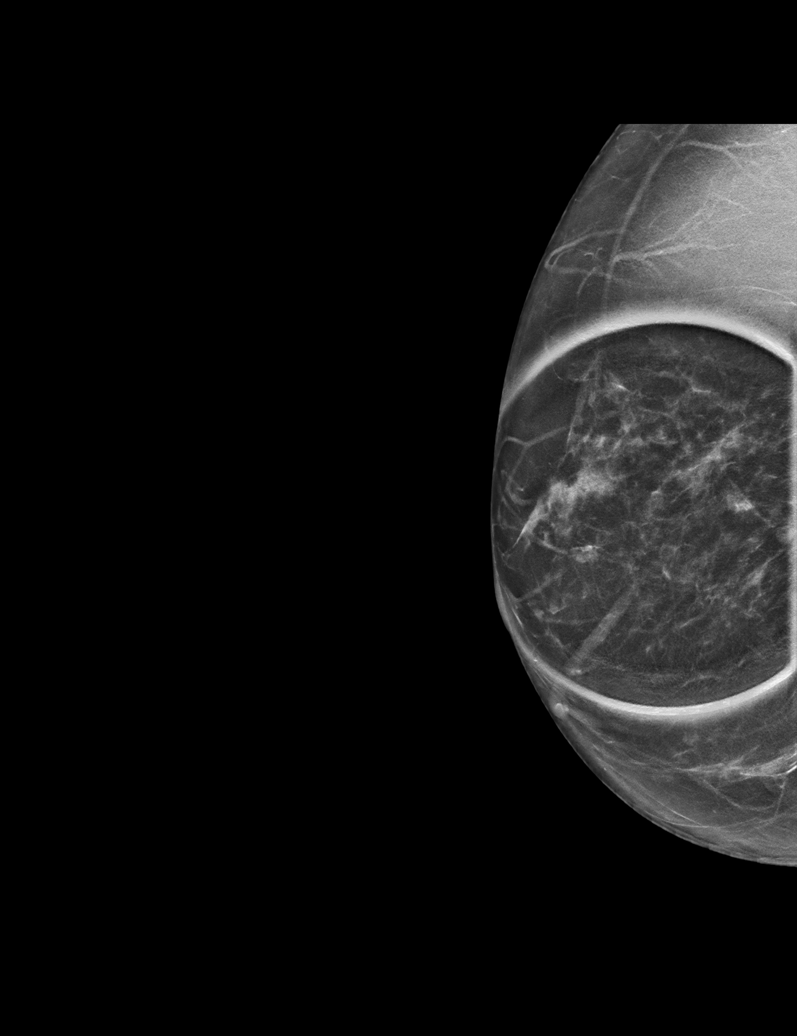

[L CC tomo · tomo slice 35/70.0]
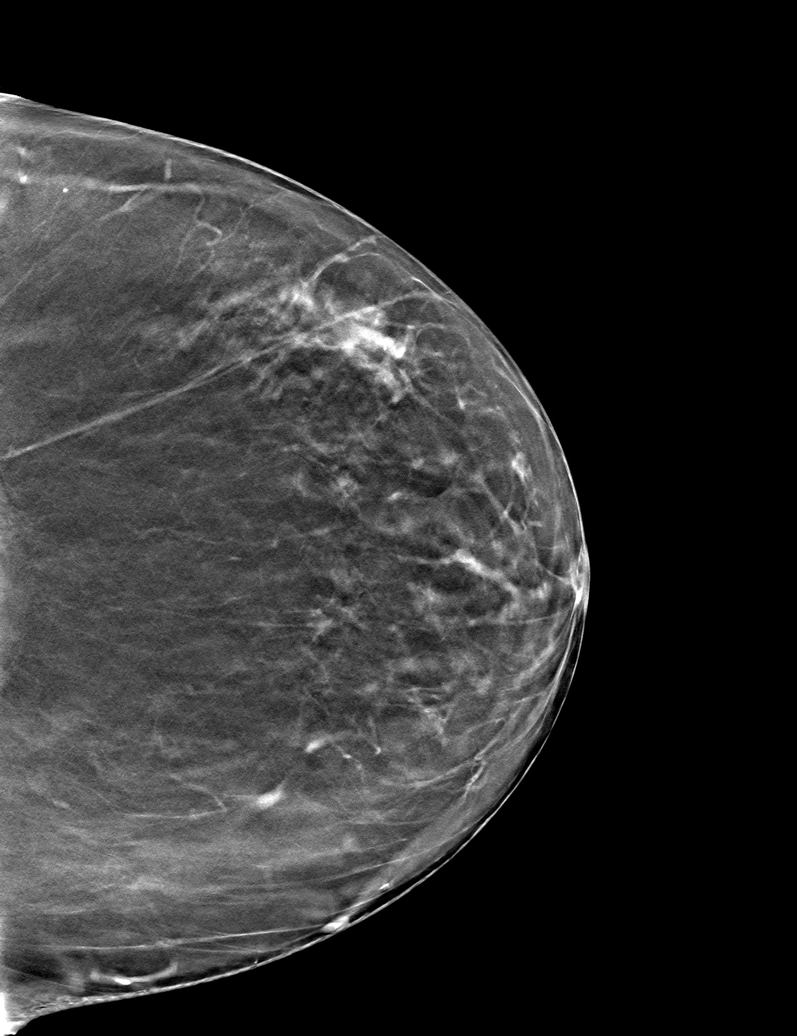

[6 of 30 positions shown; findings below may reference images not displayed]

ACR Breast Density Category b: There are scattered areas of
fibroglandular density.
FINDINGS: The mass in the lateral right breast is similar to less conspicuous
in the interval. No other suspicious findings in either breast
mammographically.

Mammographic images were processed with CAD.

On physical exam, no suspicious lumps identified.

Targeted ultrasound is performed, showing a mass at [DATE], 1 cm from
the nipple measuring 6 x 5 by 8 mm today, unchanged since 8488.
IMPRESSION: Benign right breast mass.  No evidence of malignancy.

RECOMMENDATION:
Annual screening mammography.

I have discussed the findings and recommendations with the patient.
Results were also provided in writing at the conclusion of the
visit. If applicable, a reminder letter will be sent to the patient
regarding the next appointment.

BI-RADS CATEGORY  2: Benign.

## 2018-07-31 NOTE — Telephone Encounter (Signed)
Rx request sent to pharmacy.  

## 2018-07-31 NOTE — Telephone Encounter (Signed)
  Follow up Call-  Call back number 07/28/2018 05/25/2016  Post procedure Call Back phone  # [phone]  Permission to leave phone message Yes Yes  Some recent data might be hidden     Patient questions:  Do you have a fever, pain , or abdominal swelling? No. Pain Score  0 *  Have you tolerated food without any problems? Yes.    Have you been able to return to your normal activities? Yes.    Do you have any questions about your discharge instructions: Diet   No. Medications  No. Follow up visit  No.  Do you have questions or concerns about your Care? No.  Actions: * If pain score is 4 or above: No action needed, pain <4.

## 2018-08-08 ENCOUNTER — Other Ambulatory Visit: Payer: Self-pay

## 2018-08-08 MED ORDER — OMEPRAZOLE 40 MG PO CPDR: DELAYED_RELEASE_CAPSULE | ORAL | 0 refills | Status: DC

## 2018-08-08 MED ORDER — BIS SUBCIT-METRONID-TETRACYC 140-125-125 MG PO CAPS: 3.0000 | ORAL_CAPSULE | Freq: Three times a day (TID) | ORAL | 0 refills | Status: DC

## 2018-08-11 ENCOUNTER — Other Ambulatory Visit: Payer: Self-pay

## 2018-08-11 MED ORDER — BISMUTH SUBSALICYLATE 262 MG PO TABS: 524.0000 mg | ORAL_TABLET | Freq: Four times a day (QID) | ORAL | 0 refills | Status: AC

## 2018-08-11 MED ORDER — DOXYCYCLINE HYCLATE 100 MG PO TABS: 100.0000 mg | ORAL_TABLET | Freq: Two times a day (BID) | ORAL | 0 refills | Status: AC

## 2018-08-11 MED ORDER — METRONIDAZOLE 250 MG PO TABS: 250.0000 mg | ORAL_TABLET | Freq: Four times a day (QID) | ORAL | 0 refills | Status: AC

## 2018-09-16 ENCOUNTER — Other Ambulatory Visit: Payer: Self-pay | Admitting: Cardiology

## 2018-09-19 ENCOUNTER — Other Ambulatory Visit: Payer: Self-pay | Admitting: Cardiology

## 2018-10-02 ENCOUNTER — Other Ambulatory Visit: Payer: Self-pay

## 2018-10-02 ENCOUNTER — Other Ambulatory Visit: Payer: Self-pay | Admitting: Cardiology

## 2018-10-20 ENCOUNTER — Other Ambulatory Visit: Payer: Self-pay | Admitting: Cardiology

## 2018-10-20 NOTE — Telephone Encounter (Signed)
HCTZ refilled. 

## 2018-10-20 NOTE — Telephone Encounter (Signed)
HCTZ 25 mg refilled until May appointment.

## 2018-11-06 ENCOUNTER — Telehealth: Payer: Medicare Other | Admitting: Cardiology

## 2018-11-06 ENCOUNTER — Telehealth: Payer: Self-pay | Admitting: Physician Assistant

## 2018-11-07 ENCOUNTER — Telehealth (INDEPENDENT_AMBULATORY_CARE_PROVIDER_SITE_OTHER): Payer: Medicare Other | Admitting: Physician Assistant

## 2018-11-07 ENCOUNTER — Encounter: Payer: Self-pay | Admitting: *Deleted

## 2018-11-07 VITALS — BP 146/75 | HR 71 | Ht 62.0 in | Wt 147.2 lb

## 2018-11-07 DIAGNOSIS — Z8673 Personal history of transient ischemic attack (TIA), and cerebral infarction without residual deficits: Secondary | ICD-10-CM

## 2018-11-07 DIAGNOSIS — E785 Hyperlipidemia, unspecified: Secondary | ICD-10-CM

## 2018-11-07 DIAGNOSIS — I25810 Atherosclerosis of coronary artery bypass graft(s) without angina pectoris: Secondary | ICD-10-CM

## 2018-11-07 DIAGNOSIS — I10 Essential (primary) hypertension: Secondary | ICD-10-CM

## 2018-11-07 MED ORDER — VALSARTAN 160 MG PO TABS: 160.0000 mg | ORAL_TABLET | Freq: Every day | ORAL | 1 refills | Status: DC

## 2018-11-07 NOTE — Patient Instructions (Signed)
Your physician wants you to follow-up in: 6 MONTHS WITH DR Martinique You will receive a reminder letter in the mail two months in advance. If you don't receive a letter, please call our office to schedule the follow-up appointment.  Your physician recommends that you continue on your current medications as directed. Please refer to the Current Medication list given to you today.  WE SENT A REFILL FOR VALSARTAN 160 MG DAILY TO YOUR PHARMACY - PLEASE LET us KNOW IF THIS IS ON BACK ORDER  Thank you for choosing [facility]!!

## 2018-11-07 NOTE — Progress Notes (Signed)
Virtual Visit via Telephone Note   This visit type was conducted due to national recommendations for restrictions regarding the COVID-19 Pandemic (e.g. social distancing) in an effort to limit this patient's exposure and mitigate transmission in our community.  Due to her co-morbid illnesses, this patient is at least at moderate risk for complications without adequate follow up.  This format is felt to be most appropriate for this patient at this time.  The patient did not have access to video technology/had technical difficulties with video requiring transitioning to audio format only (telephone).  All issues noted in this document were discussed and addressed.  No physical exam could be performed with this format.  Please refer to the patient's chart for her  consent to telehealth for [facility].   Date:  11/07/2018   ID:  Krista Woods, DOB [birth year], MRN [id number]  Patient Location: Home Provider Location: Home  PCP:  Leanna Battles, MD  Cardiologist:  Peter Martinique, MD  Electrophysiologist:  None   Evaluation Performed:  Follow-Up Visit  Chief Complaint:  followup  History of Present Illness:    Krista Woods is a 68 y.o. female with past medical history of hypertension, hyperlipidemia, CVA and CAD s/p CABG in 02/1999 with LIMA to LAD, SVG to D1, SVG to left circumflex.  Myoview in August 2016 was negative. He was last seen by Dr. Peter Martinique in October 2018, his blood pressure was high at the time, his primary care provider had increased amlodipine dosage.  Overall, she was doing well at the time.  More recently, she was diagnosed with heme positive stool.  She underwent upper EGD by Dr. Purnell Shoemaker in January 2020, this revealed gastritis and small hiatal hernia. Her aspirin was decreased from 325mg  to 81mg  daily.   Patient was contacted today via telephone visit.  She denies any recent chest pain or shortness of breath.  On further discussion, it seems she had a lot of medication  changes in the past year.  She is no longer on Accupril, instead she has been switched to valsartan 160 mg daily.  Her metoprolol succinate has been increased to 50 mg twice daily.  She is still on 25 mg daily of HCTZ.  Her amlodipine was also increased to 5 mg daily.  She has not had any further bleeding issue after aspirin has been decreased to 81 mg daily.  Overall she is doing quite well from cardiology perspective.  Her blood pressure is high however one of her friends wife just passed away 2 days ago and she is having some degree of anxiety.  She says her normally her systolic blood pressure is in the low 130s.  I recommended her follow-up in 6 months.   The patient does not have symptoms concerning for COVID-19 infection (fever, chills, cough, or new shortness of breath).    Past Medical History:  Diagnosis Date  . Coronary disease    past CABG in September 2000 with LIMA to LAD, SVG to 1st DX, SVG to LCX  . HTN (hypertension)   . Hyperlipidemia   . Myocardial infarction ([place]) 02/1999   triple bypass surg  . Stroke [facility])    prior to open heart surgery   Past Surgical History:  Procedure Laterality Date  . CARDIAC CATHETERIZATION  02/26/1999   ef 65%  . CARDIOVASCULAR STRESS TEST  09/23/2008   EF 71%, NORMAL.  [name] Kitchen CORONARY ARTERY BYPASS GRAFT  September 2000   This included an LIMA graft to  the LAD, saphenous vein graft to the first diagonal, saphenous vein graft to the circumflex     Current Meds  Medication Sig  . amLODipine (NORVASC) 5 MG tablet Take 5 mg by mouth daily.  [name] Kitchen aspirin 81 MG tablet Take 81 mg by mouth daily.  [name] Kitchen atorvastatin (LIPITOR) 80 MG tablet TAKE 1 TABLET BY MOUTH EVERY DAY  . bismuth-metronidazole-tetracycline (PYLERA) 140-125-125 MG capsule Take 3 capsules by mouth 4 (four) times daily -  before meals and at bedtime.  . hydrochlorothiazide (HYDRODIURIL) 25 MG tablet TAKE 1 TABLET(25 MG) BY MOUTH DAILY  . metoprolol succinate (TOPROL-XL) 50 MG 24 hr tablet  Take 50 mg by mouth 2 (two) times a day. Take with or immediately following a meal.  . Multiple Vitamin (MULTIVITAMIN) capsule Take 1 capsule by mouth daily.  . [DISCONTINUED] amLODipine (NORVASC) 2.5 MG tablet Take 1 tablet (2.5 mg total) by mouth daily.  . [DISCONTINUED] aspirin 325 MG tablet Take 325 mg by mouth daily.    . [DISCONTINUED] cholecalciferol (VITAMIN D) 1000 units tablet Take 1,000 Units by mouth daily.  . [DISCONTINUED] metoprolol succinate (TOPROL-XL) 50 MG 24 hr tablet TAKE 1 TABLET(50 MG) BY MOUTH DAILY (Patient taking differently: 2 (two) times a day. )  . [DISCONTINUED] omeprazole (PRILOSEC) 40 MG capsule Take 1 capsule twice daily while on antibiotics  . [DISCONTINUED] quinapril (ACCUPRIL) 40 MG tablet Take 1 tablet (40 mg total) by mouth at bedtime. NEED OV.  . [DISCONTINUED] sucralfate (CARAFATE) 1 g tablet TAKE 1 TABLET(1 GRAM) BY MOUTH TWICE DAILY     Allergies:   Patient has no known allergies.   Social History   Tobacco Use  . Smoking status: Never Smoker  . Smokeless tobacco: Never Used  Substance Use Topics  . Alcohol use: No  . Drug use: No     Family Hx: The patient's family history includes Cerebral aneurysm in her daughter; Diabetes in her brother and brother; Heart attack (age of onset: 8) in her mother; Heart attack (age of onset: 13) in her sister; Heart disease in her brother. There is no history of Colon cancer, Esophageal cancer, Stomach cancer, or Rectal cancer.  ROS:   Please see the history of present illness.     All other systems reviewed and are negative.   Prior CV studies:   The following studies were reviewed today:  Myoview 02/05/2015 Study Highlights    The left ventricular ejection fraction is normal (55-65%).  Nuclear stress EF: 65%.  Blood pressure demonstrated a hypertensive response to exercise.  Downsloping ST segment depression ST segment depression of 3 mm was noted during stress in the II, III, aVF, V5, V6 and  V4 leads, beginning at 2 minutes of stress, and returning to baseline after 1-5 minutes of recovery.  Defect 1: There is a small defect of mild severity.  This is a low risk study.   Low risk study. Small, mild fixed inferoapical and apical lateral defect which is likely artifact. No reversible ischemia. LVEF 65% with normal wall motion. There was significant 3 mm downsloping ST depression inferiorly and laterally on the stress EKG with exercise, however, there is voltage criteria for LVH, baseline wander is noted and this could represent repolarization abnormality or ischemia. Exercise tolerance was fair with no chest pain. There was a marked hypertensive response to exercise.       Labs/Other Tests and Data Reviewed:    EKG:  An ECG dated 09/27/2016 was personally reviewed today and demonstrated:  Sinus rhythm without significant ST-T wave changes.  Recent Labs: No results found for requested labs within last 8760 hours.   Recent Lipid Panel Lab Results  Component Value Date/Time   CHOL 133 01/28/2017 11:10 AM   TRIG 40 01/28/2017 11:10 AM   HDL 65 01/28/2017 11:10 AM   CHOLHDL 2.0 01/28/2017 11:10 AM   CHOLHDL 1.8 02/26/2016 10:02 AM   LDLCALC 60 01/28/2017 11:10 AM    Wt Readings from Last 3 Encounters:  11/07/18 147 lb 3.2 oz (66.8 kg)  07/28/18 156 lb (70.8 kg)  07/25/18 156 lb (70.8 kg)     Objective:    Vital Signs:  BP (!) 146/75   Pulse 71   Ht 5\' 2"  (1.575 m)   Wt 147 lb 3.2 oz (66.8 kg)   BMI 26.92 kg/m    VITAL SIGNS:  reviewed  ASSESSMENT & PLAN:    1. CAD s/p CABG: Aspirin has been reduced to 81 mg in January after she was found to have gastritis on EGD.  Continue statin.  Denies any exertional chest pain or shortness of breath  2. Hypertension: Blood pressure is elevated today, however normally her systolic blood pressures in the 130s.  One of her friend's wife recently passed away 2 days ago and she is having some degree of anxiety due to this.  She  has a lot of medication changes in the past year, her medication list has been updated.  See HPI for further information.  She requested a refill on her valsartan, I was agreeable to this.  However I did inform her valsartan has been on backorder in some the pharmacy recently, if her pharmacy does not have valsartan, I will attempt to switch this to losartan or irbesartan.  3. Hyperlipidemia: Continue high-dose statin.  Will request a fasting lipid panel record from PCP.  4. History of CVA: No recent recurrence.   COVID-19 Education: The signs and symptoms of COVID-19 were discussed with the patient and how to seek care for testing (follow up with PCP or arrange E-visit).  The importance of social distancing was discussed today.  Time:   Today, I have spent 13 minutes with the patient with telehealth technology discussing the above problems.     Medication Adjustments/Labs and Tests Ordered: Current medicines are reviewed at length with the patient today.  Concerns regarding medicines are outlined above.   Tests Ordered: No orders of the defined types were placed in this encounter.   Medication Changes: Meds ordered this encounter  Medications  . valsartan (DIOVAN) 160 MG tablet    Sig: Take 1 tablet (160 mg total) by mouth daily.    Dispense:  90 tablet    Refill:  1    Disposition:  Follow up in 6 month(s)  Signed, Almyra Deforest, [place]  11/07/2018 12:57 PM    [facility]

## 2018-12-26 NOTE — Telephone Encounter (Signed)
Opened in error

## 2018-12-27 ENCOUNTER — Other Ambulatory Visit: Payer: Self-pay | Admitting: Cardiology

## 2019-01-11 ENCOUNTER — Other Ambulatory Visit: Payer: Self-pay | Admitting: Cardiology

## 2019-01-12 NOTE — Telephone Encounter (Signed)
Outpatient Medication Detail   Disp Refills Start End   metoprolol succinate (TOPROL-XL) 50 MG 24 hr tablet 90 tablet 2 12/28/2018    Sig: TAKE 1 TABLET(50 MG) BY MOUTH DAILY   Sent to pharmacy as: metoprolol succinate (TOPROL-XL) 50 MG 24 hr tablet   E-Prescribing Status: Receipt confirmed by pharmacy (12/28/2018 8:08 AM EDT)   [facility] [phone] - [place], [address] AT [address]

## 2019-02-02 ENCOUNTER — Telehealth: Payer: Self-pay

## 2019-02-02 NOTE — Progress Notes (Signed)
Called patient on her mobile number and she stated that she was given the results when she went to her PCP. Patient stated that she understood the results and all (if any) questions were answered.

## 2019-02-02 NOTE — Telephone Encounter (Signed)
-----   Message from [place], [place] sent at 01/31/2019 11:55 AM EDT ----- Outside lab reviewed. Kidney function and electrolyte ok. Total cholesterol and good cholesterol ok. Bad cholesterol (LDL) borderline high. Continue on the current high dose lipitor, diet and exercise, repeat lab in 6 month by PCP

## 2019-02-02 NOTE — Telephone Encounter (Signed)
Called patient on her mobile number and she stated that she was given the results when she went to her PCP. Patient stated that she understood the results and all (if any) questions were answered.

## 2019-03-03 ENCOUNTER — Other Ambulatory Visit: Payer: Self-pay | Admitting: Cardiology

## 2019-04-18 ENCOUNTER — Other Ambulatory Visit: Payer: Self-pay | Admitting: Cardiology

## 2019-07-20 ENCOUNTER — Ambulatory Visit: Payer: Medicare PPO | Attending: Internal Medicine

## 2019-07-20 DIAGNOSIS — Z23 Encounter for immunization: Secondary | ICD-10-CM | POA: Insufficient documentation

## 2019-07-23 NOTE — Progress Notes (Signed)
   Covid-19 Vaccination Clinic  Name:  Krista Woods    MRN: [id number] DOB: [birth year]  07/20/2019  Krista Woods was observed post Covid-19 immunization for 15 minutes without incidence. She was provided with Vaccine Information Sheet and instruction to access the V-Safe system.   Krista Woods was instructed to call 911 with any severe reactions post vaccine: [name] Kitchen Difficulty breathing  . Swelling of your face and throat  . A fast heartbeat  . A bad rash all over your body  . Dizziness and weakness    Immunizations Administered    Name Date Dose VIS Date Route   Moderna COVID-19 Vaccine 07/20/2019 11:25 AM 0.5 mL 05/29/2019 Intramuscular   Manufacturer: Moderna   Lot: [id number]   [id number]

## 2019-07-24 NOTE — Progress Notes (Deleted)
Krista Woods Date of Birth: 19-Jun-1951 Medical Record [id number]  History of Present Illness: Krista Woods is seen for follow up of CAD and HTN.  Has known CAD with remote CABG in 2000. Other issues include HTN and HLD. Negative Myoview August 2016. She also has a strong family history of vascular disease. Her sister and 2 brothers have had an MI and daughter had a cerebral aneurysm. Another sister had a major stroke.  She was diagnosed with heme positive stool.  She underwent upper EGD by Dr. Purnell Shoemaker in January 2020, this revealed gastritis and small hiatal hernia. Her aspirin was decreased from 325mg  to 81mg  daily.    On follow up today she notes that her BP just shot up. It was 201/105 this past Friday. Saw Dr. Philip Aspen who recommended increasing amlodipine to 5 mg daily. She became scared on Saturday so went to the ED. She states she feels great otherwise. She has changed her diet cutting out "white" foods and lost 10 lbs. Cooking more for herself. Is concerned that her cooking has made her BP go up but she doesn't use sodium in cooking.  Exercising regularly and tries to eat a  healthy diet. Walks 15K steps daily. No chest pain or SOB. No palpitations.   Current Outpatient Medications  Medication Sig Dispense Refill  . amLODipine (NORVASC) 5 MG tablet Take 5 mg by mouth daily.    [name] Kitchen aspirin 81 MG tablet Take 81 mg by mouth daily.    [name] Kitchen atorvastatin (LIPITOR) 80 MG tablet Take 1 tablet (80 mg total) by mouth daily at 6 PM. 30 tablet 3  . bismuth-metronidazole-tetracycline (PYLERA) 140-125-125 MG capsule Take 3 capsules by mouth 4 (four) times daily -  before meals and at bedtime. 120 capsule 0  . hydrochlorothiazide (HYDRODIURIL) 25 MG tablet TAKE 1 TABLET(25 MG) BY MOUTH DAILY 30 tablet 3  . metoprolol succinate (TOPROL-XL) 50 MG 24 hr tablet TAKE 1 TABLET(50 MG) BY MOUTH DAILY 90 tablet 2  . Multiple Vitamin (MULTIVITAMIN) capsule Take 1 capsule by mouth daily.    . valsartan (DIOVAN) 160 MG  tablet Take 1 tablet (160 mg total) by mouth daily. 90 tablet 1   No current facility-administered medications for this visit.    No Known Allergies  Past Medical History:  Diagnosis Date  . Coronary disease    past CABG in September 2000 with LIMA to LAD, SVG to 1st DX, SVG to LCX  . HTN (hypertension)   . Hyperlipidemia   . Myocardial infarction ([place]) 02/1999   triple bypass surg  . Stroke [facility])    prior to open heart surgery    Past Surgical History:  Procedure Laterality Date  . CARDIAC CATHETERIZATION  02/26/1999   ef 65%  . CARDIOVASCULAR STRESS TEST  09/23/2008   EF 71%, NORMAL.  [name] Kitchen CORONARY ARTERY BYPASS GRAFT  September 2000   This included an LIMA graft to the LAD, saphenous vein graft to the first diagonal, saphenous vein graft to the circumflex    Social History   Tobacco Use  Smoking Status Never Smoker  Smokeless Tobacco Never Used    Social History   Substance and Sexual Activity  Alcohol Use No    Family History  Problem Relation Age of Onset  . Heart attack Mother 94  . Heart attack Sister 33       MI  . Cerebral aneurysm Daughter   . Diabetes Brother   . Heart disease Brother   . Diabetes  Brother   . Colon cancer Neg Hx   . Esophageal cancer Neg Hx   . Stomach cancer Neg Hx   . Rectal cancer Neg Hx     Review of Systems: The review of systems is per the HPI.  All other systems were reviewed and are negative.  Physical Exam: There were no vitals taken for this visit. GENERAL:  Well appearing BF in NAD HEENT:  PERRL, EOMI, sclera are clear. Oropharynx is clear. NECK:  No jugular venous distention, carotid upstroke brisk and symmetric, no bruits, no thyromegaly or adenopathy LUNGS:  Clear to auscultation bilaterally CHEST:  Unremarkable HEART:  RRR,  PMI not displaced or sustained,S1 and S2 within normal limits, no S3, no S4: no clicks, no rubs, no murmurs ABD:  Soft, nontender. BS +, no masses or bruits. No hepatomegaly, no  splenomegaly EXT:  2 + pulses throughout, no edema, no cyanosis no clubbing SKIN:  Warm and dry.  No rashes NEURO:  Alert and oriented x 3. Cranial nerves II through XII intact. PSYCH:  Cognitively intact    LABORATORY DATA: Lab Results  Component Value Date   WBC 6.7 03/08/2014   HGB 13.8 03/08/2014   HCT 39.7 03/08/2014   PLT 270 03/08/2014   GLUCOSE 115 (H) 01/28/2017   CHOL 133 01/28/2017   TRIG 40 01/28/2017   HDL 65 01/28/2017   LDLCALC 60 01/28/2017   ALT 13 01/28/2017   AST 15 01/28/2017   NA 140 01/28/2017   K 4.0 01/28/2017   CL 98 01/28/2017   CREATININE 0.78 01/28/2017   BUN 13 01/28/2017   CO2 28 01/28/2017   Dated 01/16/19: cholesterol 159, triglycerides 53, HDL 62, LDL 86. CMET and CBC normal. A1c 6.3%.   Myoview: 02/05/15: Study Highlights     The left ventricular ejection fraction is normal (55-65%).  Nuclear stress EF: 65%.  Blood pressure demonstrated a hypertensive response to exercise.  Downsloping ST segment depression ST segment depression of 3 mm was noted during stress in the II, III, aVF, V5, V6 and V4 leads, beginning at 2 minutes of stress, and returning to baseline after 1-5 minutes of recovery.  Defect 1: There is a small defect of mild severity.  This is a low risk study.  Low risk study. Small, mild fixed inferoapical and apical lateral defect which is likely artifact. No reversible ischemia. LVEF 65% with normal wall motion. There was significant 3 mm downsloping ST depression inferiorly and laterally on the stress EKG with exercise, however, there is voltage criteria for LVH, baseline wander is noted and this could represent repolarization abnormality or ischemia. Exercise tolerance was fair with no chest pain. There was a marked hypertensive response to exercise.     Assessment / Plan: 1. CAD - remote CABG 16 years ago - negative Myoview in August 2016. asymptomatic. Continue current medical therapy.  2. HTN - Continue sodium  restriction.  Avoid salt.   3. HLD - excellent control- continue lipitor.   4. ? Prediabetes. BS last 4 years tends to be 115-120. She has made dietary changes with reduction in simple carbs with good weight loss of 10 lbs. Will monitor with diet.   I will follow up in 6 months

## 2019-07-26 ENCOUNTER — Other Ambulatory Visit: Payer: Self-pay | Admitting: Cardiology

## 2019-08-02 ENCOUNTER — Ambulatory Visit: Payer: Medicare Other | Admitting: Cardiology

## 2019-08-14 NOTE — Progress Notes (Signed)
Virtual Visit via Telephone Note   This visit type was conducted due to national recommendations for restrictions regarding the COVID-19 Pandemic (e.g. social distancing) in an effort to limit this patient's exposure and mitigate transmission in our community.  Due to her co-morbid illnesses, this patient is at least at moderate risk for complications without adequate follow up.  This format is felt to be most appropriate for this patient at this time.  The patient did not have access to video technology/had technical difficulties with video requiring transitioning to audio format only (telephone).  All issues noted in this document were discussed and addressed.  No physical exam could be performed with this format.  Please refer to the patient's chart for her  consent to telehealth for [facility].   Date:  08/14/2019   ID:  Krista Woods, DOB [birth year], MRN [id number]  Patient Location: Home Provider Location: Home  PCP:  Leanna Battles, MD  Cardiologist:  Roniya Tetro [place], MD  Electrophysiologist:  None   Evaluation Performed:  Follow-Up Visit  Chief Complaint:  Follow-up CAD  History of Present Illness:    Krista Woods is a 69 y.o. female with past medical history of hypertension, hyperlipidemia, CVA and CAD s/p CABG in 02/1999 with LIMA to LAD, SVG to D1, SVG to left circumflex.  Myoview in August 2016 was negative.   One year ago she was diagnosed with heme positive stool.  She underwent upper EGD by Dr. Purnell Shoemaker in January 2020, this revealed gastritis and small hiatal hernia. Her aspirin was decreased from 325mg  to 81mg  daily.   On follow up today she reports she is doing very well. She is very active and walks a lot. She denies any chest pain, dyspnea, palpitations. No edema. Does admit to cheating on her diet more. Reports A1c a week ago was 6.5%.   The patient does not have symptoms concerning for COVID-19 infection (fever, chills, cough, or new shortness of breath).    Past  Medical History:  Diagnosis Date  . Coronary disease    past CABG in September 2000 with LIMA to LAD, SVG to 1st DX, SVG to LCX  . HTN (hypertension)   . Hyperlipidemia   . Myocardial infarction ([place]) 02/1999   triple bypass surg  . Stroke [facility])    prior to open heart surgery   Past Surgical History:  Procedure Laterality Date  . CARDIAC CATHETERIZATION  02/26/1999   ef 65%  . CARDIOVASCULAR STRESS TEST  09/23/2008   EF 71%, NORMAL.  [name] Kitchen CORONARY ARTERY BYPASS GRAFT  September 2000   This included an LIMA graft to the LAD, saphenous vein graft to the first diagonal, saphenous vein graft to the circumflex     No outpatient medications have been marked as taking for the 08/15/19 encounter (Appointment) with [place], Filicia Scogin M, MD.     Allergies:   Patient has no known allergies.   Social History   Tobacco Use  . Smoking status: Never Smoker  . Smokeless tobacco: Never Used  Substance Use Topics  . Alcohol use: No  . Drug use: No     Family Hx: The patient's family history includes Cerebral aneurysm in her daughter; Diabetes in her brother and brother; Heart attack (age of onset: 51) in her mother; Heart attack (age of onset: 54) in her sister; Heart disease in her brother. There is no history of Colon cancer, Esophageal cancer, Stomach cancer, or Rectal cancer.  ROS:   Please see the history  of present illness.     All other systems reviewed and are negative.   Prior CV studies:   The following studies were reviewed today:  Myoview 02/05/2015 Study Highlights    The left ventricular ejection fraction is normal (55-65%).  Nuclear stress EF: 65%.  Blood pressure demonstrated a hypertensive response to exercise.  Downsloping ST segment depression ST segment depression of 3 mm was noted during stress in the II, III, aVF, V5, V6 and V4 leads, beginning at 2 minutes of stress, and returning to baseline after 1-5 minutes of recovery.  Defect 1: There is a small defect of  mild severity.  This is a low risk study.   Low risk study. Small, mild fixed inferoapical and apical lateral defect which is likely artifact. No reversible ischemia. LVEF 65% with normal wall motion. There was significant 3 mm downsloping ST depression inferiorly and laterally on the stress EKG with exercise, however, there is voltage criteria for LVH, baseline wander is noted and this could represent repolarization abnormality or ischemia. Exercise tolerance was fair with no chest pain. There was a marked hypertensive response to exercise.       Labs/Other Tests and Data Reviewed:    EKG:  An ECG dated 09/27/2016 was personally reviewed today and demonstrated:  Sinus rhythm without significant ST-T wave changes.  Recent Labs: No results found for requested labs within last 8760 hours.   Recent Lipid Panel Lab Results  Component Value Date/Time   CHOL 133 01/28/2017 11:10 AM   TRIG 40 01/28/2017 11:10 AM   HDL 65 01/28/2017 11:10 AM   CHOLHDL 2.0 01/28/2017 11:10 AM   CHOLHDL 1.8 02/26/2016 10:02 AM   LDLCALC 60 01/28/2017 11:10 AM   Labs dated 01/16/19: cholesterol  159, triglycerides 53, HDL 62. LDL 86. A1c 6.3%. glucose 126. Otherwise CMET normal. CBC, UA are normal.     Wt Readings from Last 3 Encounters:  11/07/18 147 lb 3.2 oz (66.8 kg)  07/28/18 156 lb (70.8 kg)  07/25/18 156 lb (70.8 kg)     Objective:    Vital Signs:  There were no vitals taken for this visit.   VITAL SIGNS:  reviewed  ASSESSMENT & PLAN:    1. CAD s/p CABG: on ASA 81 mg. Continue statin.  She is asymptomatic.  2. Hypertension: BP is under fair control. She reports normally BP is better but she ate something she shouldn't last night. Continue current therapy  3. Hyperlipidemia: Continue high-dose statin.  Last LDL had increased from 60 to 86. This may be related to her diet. Goal is < 70. Instructed her on heart healthy and carbohydrate restricted diet. If on repeat blood work LDL is still not  at goal may need to add Zetia or a PCSK 9 inhibitor.   4.   History of CVA: No recent recurrence.  5.   DM A1c increased to 6.5%. really focus on dietary modification. Will defer medical management to primary care.    COVID-19 Education: The signs and symptoms of COVID-19 were discussed with the patient and how to seek care for testing (follow up with PCP or arrange E-visit).  The importance of social distancing was discussed today.  Time:   Today, I have spent 15 minutes with the patient with telehealth technology discussing the above problems.     Medication Adjustments/Labs and Tests Ordered: Current medicines are reviewed at length with the patient today.  Concerns regarding medicines are outlined above.   Tests Ordered: No orders  of the defined types were placed in this encounter.   Medication Changes: No orders of the defined types were placed in this encounter.   Disposition:  Follow up in one year  Signed, Renie Stelmach [place], MD  08/14/2019 8:18 AM    [place]

## 2019-08-15 ENCOUNTER — Telehealth (INDEPENDENT_AMBULATORY_CARE_PROVIDER_SITE_OTHER): Payer: Medicare PPO | Admitting: Cardiology

## 2019-08-15 ENCOUNTER — Encounter: Payer: Self-pay | Admitting: Cardiology

## 2019-08-15 VITALS — BP 149/80 | HR 66 | Ht 63.0 in | Wt 145.2 lb

## 2019-08-15 DIAGNOSIS — I25810 Atherosclerosis of coronary artery bypass graft(s) without angina pectoris: Secondary | ICD-10-CM | POA: Diagnosis not present

## 2019-08-15 DIAGNOSIS — I10 Essential (primary) hypertension: Secondary | ICD-10-CM

## 2019-08-15 DIAGNOSIS — E119 Type 2 diabetes mellitus without complications: Secondary | ICD-10-CM

## 2019-08-15 DIAGNOSIS — E785 Hyperlipidemia, unspecified: Secondary | ICD-10-CM

## 2019-08-15 NOTE — Patient Instructions (Signed)
Medication Instructions:  Continue same medications *If you need a refill on your cardiac medications before your next appointment, please call your pharmacy*  Lab Work: None ordered   Testing/Procedures: None ordered  Follow-Up: At [facility], you and your health needs are our priority.  As part of our continuing mission to provide you with exceptional heart care, we have created designated Provider Care Teams.  These Care Teams include your primary Cardiologist (physician) and Advanced Practice Providers (APPs -  Physician Assistants and Nurse Practitioners) who all work together to provide you with the care you need, when you need it.  Your next appointment:  1 year   ( Call in Oct to schedule Feb appointment )  The format for your next appointment: Office     Provider: Dr.Jordan

## 2019-08-17 ENCOUNTER — Ambulatory Visit: Payer: Medicare PPO

## 2019-08-24 ENCOUNTER — Ambulatory Visit: Payer: Medicare PPO | Attending: Internal Medicine

## 2019-08-24 DIAGNOSIS — Z23 Encounter for immunization: Secondary | ICD-10-CM | POA: Insufficient documentation

## 2019-08-24 NOTE — Progress Notes (Signed)
   Covid-19 Vaccination Clinic  Name:  SHANEA MONTINI    MRN: [id number] DOB: [birth year]  08/24/2019  Ms. Kane was observed post Covid-19 immunization for 15 minutes without incidence. She was provided with Vaccine Information Sheet and instruction to access the V-Safe system.   Ms. Snead was instructed to call 911 with any severe reactions post vaccine: [name] Kitchen Difficulty breathing  . Swelling of your face and throat  . A fast heartbeat  . A bad rash all over your body  . Dizziness and weakness    Immunizations Administered    Name Date Dose VIS Date Route   Moderna COVID-19 Vaccine 08/24/2019  9:27 AM 0.5 mL 05/29/2019 Intramuscular   Manufacturer: Moderna   Lot: [id number]   [id number]

## 2019-09-24 ENCOUNTER — Other Ambulatory Visit: Payer: Self-pay | Admitting: Cardiology

## 2019-10-24 ENCOUNTER — Other Ambulatory Visit: Payer: Self-pay | Admitting: Cardiology

## 2019-11-25 DIAGNOSIS — S20221A Contusion of right back wall of thorax, initial encounter: Secondary | ICD-10-CM | POA: Diagnosis not present

## 2019-11-25 DIAGNOSIS — S20224A Contusion of middle back wall of thorax, initial encounter: Secondary | ICD-10-CM | POA: Diagnosis not present

## 2019-11-25 DIAGNOSIS — M549 Dorsalgia, unspecified: Secondary | ICD-10-CM | POA: Diagnosis not present

## 2019-11-25 DIAGNOSIS — E785 Hyperlipidemia, unspecified: Secondary | ICD-10-CM | POA: Diagnosis not present

## 2019-11-25 DIAGNOSIS — I2510 Atherosclerotic heart disease of native coronary artery without angina pectoris: Secondary | ICD-10-CM | POA: Diagnosis not present

## 2019-11-25 DIAGNOSIS — E119 Type 2 diabetes mellitus without complications: Secondary | ICD-10-CM | POA: Diagnosis not present

## 2019-11-25 DIAGNOSIS — I10 Essential (primary) hypertension: Secondary | ICD-10-CM | POA: Diagnosis not present

## 2019-11-25 DIAGNOSIS — R0781 Pleurodynia: Secondary | ICD-10-CM | POA: Diagnosis not present

## 2019-11-25 DIAGNOSIS — S299XXA Unspecified injury of thorax, initial encounter: Secondary | ICD-10-CM | POA: Diagnosis not present

## 2019-11-27 DIAGNOSIS — M25462 Effusion, left knee: Secondary | ICD-10-CM | POA: Diagnosis not present

## 2019-11-27 DIAGNOSIS — I2510 Atherosclerotic heart disease of native coronary artery without angina pectoris: Secondary | ICD-10-CM | POA: Diagnosis not present

## 2019-11-27 DIAGNOSIS — M25562 Pain in left knee: Secondary | ICD-10-CM | POA: Diagnosis not present

## 2019-11-27 DIAGNOSIS — E785 Hyperlipidemia, unspecified: Secondary | ICD-10-CM | POA: Diagnosis not present

## 2019-11-27 DIAGNOSIS — I10 Essential (primary) hypertension: Secondary | ICD-10-CM | POA: Diagnosis not present

## 2020-01-06 ENCOUNTER — Other Ambulatory Visit: Payer: Self-pay | Admitting: Cardiology

## 2020-01-19 ENCOUNTER — Other Ambulatory Visit: Payer: Self-pay | Admitting: Cardiology

## 2020-01-22 ENCOUNTER — Other Ambulatory Visit: Payer: Self-pay | Admitting: Cardiology

## 2020-01-31 DIAGNOSIS — Z0000 Encounter for general adult medical examination without abnormal findings: Secondary | ICD-10-CM | POA: Diagnosis not present

## 2020-01-31 DIAGNOSIS — E119 Type 2 diabetes mellitus without complications: Secondary | ICD-10-CM | POA: Diagnosis not present

## 2020-01-31 DIAGNOSIS — E7849 Other hyperlipidemia: Secondary | ICD-10-CM | POA: Diagnosis not present

## 2020-02-07 DIAGNOSIS — Z0000 Encounter for general adult medical examination without abnormal findings: Secondary | ICD-10-CM | POA: Diagnosis not present

## 2020-02-07 DIAGNOSIS — Z1212 Encounter for screening for malignant neoplasm of rectum: Secondary | ICD-10-CM | POA: Diagnosis not present

## 2020-02-07 DIAGNOSIS — M25561 Pain in right knee: Secondary | ICD-10-CM | POA: Diagnosis not present

## 2020-02-07 DIAGNOSIS — G8929 Other chronic pain: Secondary | ICD-10-CM | POA: Diagnosis not present

## 2020-02-07 DIAGNOSIS — I25810 Atherosclerosis of coronary artery bypass graft(s) without angina pectoris: Secondary | ICD-10-CM | POA: Diagnosis not present

## 2020-02-07 DIAGNOSIS — E119 Type 2 diabetes mellitus without complications: Secondary | ICD-10-CM | POA: Diagnosis not present

## 2020-02-07 DIAGNOSIS — E785 Hyperlipidemia, unspecified: Secondary | ICD-10-CM | POA: Diagnosis not present

## 2020-02-07 DIAGNOSIS — R82998 Other abnormal findings in urine: Secondary | ICD-10-CM | POA: Diagnosis not present

## 2020-02-07 DIAGNOSIS — F5101 Primary insomnia: Secondary | ICD-10-CM | POA: Diagnosis not present

## 2020-02-07 DIAGNOSIS — I10 Essential (primary) hypertension: Secondary | ICD-10-CM | POA: Diagnosis not present

## 2020-03-18 ENCOUNTER — Ambulatory Visit: Payer: Medicare PPO | Admitting: Neurology

## 2020-03-18 ENCOUNTER — Other Ambulatory Visit: Payer: Self-pay

## 2020-03-18 ENCOUNTER — Encounter: Payer: Self-pay | Admitting: Neurology

## 2020-03-18 VITALS — BP 154/92 | HR 83 | Ht 62.0 in | Wt 145.3 lb

## 2020-03-18 DIAGNOSIS — Z951 Presence of aortocoronary bypass graft: Secondary | ICD-10-CM | POA: Diagnosis not present

## 2020-03-18 DIAGNOSIS — G4700 Insomnia, unspecified: Secondary | ICD-10-CM

## 2020-03-18 DIAGNOSIS — G479 Sleep disorder, unspecified: Secondary | ICD-10-CM

## 2020-03-18 DIAGNOSIS — R030 Elevated blood-pressure reading, without diagnosis of hypertension: Secondary | ICD-10-CM | POA: Diagnosis not present

## 2020-03-18 DIAGNOSIS — E663 Overweight: Secondary | ICD-10-CM | POA: Diagnosis not present

## 2020-03-18 DIAGNOSIS — R0683 Snoring: Secondary | ICD-10-CM

## 2020-03-18 DIAGNOSIS — R351 Nocturia: Secondary | ICD-10-CM | POA: Diagnosis not present

## 2020-03-18 DIAGNOSIS — G4719 Other hypersomnia: Secondary | ICD-10-CM | POA: Diagnosis not present

## 2020-03-18 NOTE — Progress Notes (Signed)
Subjective:    Patient ID: Krista Woods is a 69 y.o. female.  HPI     Star Age, MD, PhD [facility] [address] [place], [place] [address]  Dear Dr. Philip Aspen,   I saw your patient, Krista Woods, upon your kind request in my sleep clinic today for initial consultation of her sleep disorder, in particular, with difficulty initiating and maintaining sleep.  The patient is unaccompanied today.  As you know, Krista Woods is a 69 year old right-handed woman with an underlying medical history of stroke, heart disease with history of MI, s/p CABG (3 vessel, 02/1999), hypertension, hyperlipidemia, and mildly overweight state, who reports snoring and excessive daytime somnolence, as well as difficulty maintaining sleep.  She has some difficulty initiating sleep as well.  I reviewed your office note from 02/07/2020.  Her Epworth sleepiness score is 9 out of 24, fatigue severity score is 46 out of 63.  She lives alone.  She has 2 grown daughters, one lives in [place], the other in [place].  She has no family history of sleep apnea that she knows of.  She had tonsillectomy at age 69 years old.  She has nocturia about 3 times per average night, she tries to be in bed around 830 but watches TV in bed.  Often the TV stays on, sometimes she turns it up when she wakes up in the middle of the night.  She typically gets up around 5 but sometimes she is already up around 2 or 3 AM.  She is a non-smoker.  She does not drink alcohol.  She drinks caffeine in limitation, in the form of soda, 1/day on average.  She denies recurrent morning headaches.  She retired at age 69.  Her blood pressure numbers have been elevated.  She checks it at home as well.  She is on 4 different blood pressure medications and has had difficult to control blood pressure.  She has never had a sleep study but would be willing to get checked out, to see if sleep apnea could be a contributor for her difficult  to control blood pressure.  Her Past Medical History Is Significant For: Past Medical History:  Diagnosis Date  . Coronary disease    past CABG in September 2000 with LIMA to LAD, SVG to 1st DX, SVG to LCX  . HTN (hypertension)   . Hyperlipidemia   . Myocardial infarction ([place]) 02/1999   triple bypass surg  . Stroke [facility])    prior to open heart surgery    Her Past Surgical History Is Significant For: Past Surgical History:  Procedure Laterality Date  . CARDIAC CATHETERIZATION  02/26/1999   ef 65%  . CARDIOVASCULAR STRESS TEST  09/23/2008   EF 71%, NORMAL.  [name] Kitchen CORONARY ARTERY BYPASS GRAFT  September 2000   This included an LIMA graft to the LAD, saphenous vein graft to the first diagonal, saphenous vein graft to the circumflex    Her Family History Is Significant For: Family History  Problem Relation Age of Onset  . Heart attack Mother 77  . Heart attack Sister 65       MI  . Cerebral aneurysm Daughter   . Diabetes Brother   . Heart disease Brother   . Diabetes Brother   . Colon cancer Neg Hx   . Esophageal cancer Neg Hx   . Stomach cancer Neg Hx   . Rectal cancer Neg Hx     Her Social History  Is Significant For: Social History   Socioeconomic History  . Marital status: Widowed    Spouse name: Not on file  . Number of children: 2  . Years of education: Not on file  . Highest education level: Not on file  Occupational History  . Occupation: school system    Comment: retired  Tobacco Use  . Smoking status: Never Smoker  . Smokeless tobacco: Never Used  Substance and Sexual Activity  . Alcohol use: No  . Drug use: No  . Sexual activity: Never  Other Topics Concern  . Not on file  Social History Narrative  . Not on file   Social Determinants of Health   Financial Resource Strain:   . Difficulty of Paying Living Expenses: Not on file  Food Insecurity:   . Worried About Charity fundraiser in the Last Year: Not on file  . Ran Out of Food in the Last Year:  Not on file  Transportation Needs:   . Lack of Transportation (Medical): Not on file  . Lack of Transportation (Non-Medical): Not on file  Physical Activity:   . Days of Exercise per Week: Not on file  . Minutes of Exercise per Session: Not on file  Stress:   . Feeling of Stress : Not on file  Social Connections:   . Frequency of Communication with Friends and Family: Not on file  . Frequency of Social Gatherings with Friends and Family: Not on file  . Attends Religious Services: Not on file  . Active Member of Clubs or Organizations: Not on file  . Attends Archivist Meetings: Not on file  . Marital Status: Not on file    Her Allergies Are:  No Known Allergies:   Her Current Medications Are:  Outpatient Encounter Medications as of 03/18/2020  Medication Sig  . amLODipine (NORVASC) 5 MG tablet Take 5 mg by mouth daily.  [name] Kitchen aspirin 81 MG tablet Take 81 mg by mouth daily.  [name] Kitchen atorvastatin (LIPITOR) 80 MG tablet TAKE 1 TABLET(80 MG) BY MOUTH DAILY AT 6 PM  . hydrochlorothiazide (HYDRODIURIL) 25 MG tablet TAKE 1 TABLET(25 MG) BY MOUTH DAILY  . metoprolol succinate (TOPROL-XL) 50 MG 24 hr tablet TAKE 1 TABLET(50 MG) BY MOUTH DAILY  . Multiple Vitamin (MULTIVITAMIN) capsule Take 1 capsule by mouth daily.  . valsartan (DIOVAN) 160 MG tablet Take 1 tablet (160 mg total) by mouth daily.   No facility-administered encounter medications on file as of 03/18/2020.  :  Review of Systems:  Out of a complete 14 point review of systems, all are reviewed and negative with the exception of these symptoms as listed below: Review of Systems  Neurological:       Here for sleep consult. No prior sleep study. Pt reports snoring is present.   Epworth Sleepiness Scale 0= would never doze 1= slight chance of dozing 2= moderate chance of dozing 3= high chance of dozing  Sitting and reading:1 Watching TV:3 Sitting inactive in a public place (ex. Theater or meeting):3 As a passenger in a  car for an hour without a break:0 Lying down to rest in the afternoon:1 Sitting and talking to someone:0 Sitting quietly after lunch (no alcohol):1 In a car, while stopped in traffic:0 Total:9     Objective:  Neurological Exam  Physical Exam Physical Examination:   Vitals:   03/18/20 1331  BP: (!) 154/92  Pulse: 83  SpO2: 96%    General Examination: The patient is a very pleasant  69 y.o. female in no acute distress. She appears well-developed and well-nourished and well groomed.   HEENT: Normocephalic, atraumatic, pupils are equal, round and reactive to light, extraocular tracking is good without limitation to gaze excursion or nystagmus noted. Hearing is grossly intact. Face is symmetric with normal facial animation. Speech is clear with no dysarthria noted. There is no hypophonia. There is no lip, neck/head, jaw or voice tremor. Neck is supple with full range of passive and active motion. There are no carotid bruits on auscultation. Oropharynx exam reveals: mild mouth dryness, good dental hygiene and mild airway crowding, due to small airway entry.  Tonsils are absent, Mallampati class I, tongue protrudes centrally in palate elevates symmetrically.  Neck circumference is 13 inches.  She has a minimal overbite.  Chest: Clear to auscultation without wheezing, rhonchi or crackles noted.  Heart: S1+S2+0, regular and normal without murmurs, rubs or gallops noted.   Abdomen: Soft, non-tender and non-distended with normal bowel sounds appreciated on auscultation.  Extremities: There is no pitting edema in the distal lower extremities bilaterally.   Skin: Warm and dry without trophic changes noted.   Musculoskeletal: exam reveals no obvious joint deformities, tenderness or joint swelling or erythema.   Neurologically:  Mental status: The patient is awake, alert and oriented in all 4 spheres. Her immediate and remote memory, attention, language skills and fund of knowledge are  appropriate. There is no evidence of aphasia, agnosia, apraxia or anomia. Speech is clear with normal prosody and enunciation. Thought process is linear. Mood is normal and affect is normal.  Cranial nerves II - XII are as described above under HEENT exam.  Motor exam: Normal bulk, strength and tone is noted. There is no tremor, Romberg is negative. Fine motor skills and coordination: grossly intact.  Cerebellar testing: No dysmetria or intention tremor. There is no truncal or gait ataxia.  Sensory exam: intact to light touch in the upper and lower extremities.  Gait, station and balance: She stands easily. No veering to one side is noted. No leaning to one side is noted. Posture is age-appropriate and stance is narrow based. Gait shows normal stride length and normal pace. No problems turning are noted. Tandem walk is slightly challenging for her.  Assessment and Plan:   In summary, Krista Woods is a very pleasant 69 y.o.-year old female with an underlying medical history of stroke, heart disease with history of MI, s/p CABG (3 vessel, 02/1999), hypertension, hyperlipidemia, and mildly overweight state, who presents for evaluation of her sleep disorder, in particular, difficulty maintaining sleep, some concern for underlying obstructive sleep apnea secondary to difficulty with control hypertension.  She also has a history of coronary artery disease with history of MI and status post open heart surgery some 21 years ago.   I had a long chat with the patient about my findings and the diagnosis of OSA, its prognosis and treatment options. We talked about medical treatments, surgical interventions and non-pharmacological approaches. I explained in particular the risks and ramifications of untreated moderate to severe OSA, especially with respect to developing cardiovascular disease down the Road, including congestive heart failure, difficult to treat hypertension, cardiac arrhythmias, or stroke. Even type 2  diabetes has, in part, been linked to untreated OSA. Symptoms of untreated OSA include daytime sleepiness, memory problems, mood irritability and mood disorder such as depression and anxiety, lack of energy, as well as recurrent headaches, especially morning headaches. We talked about trying to maintain a healthy lifestyle in general,  as well as the importance of weight control. We also talked about the importance of good sleep hygiene. I recommended the following at this time: sleep study.  I explained the sleep test procedure to the patient and also outlined possible surgical and non-surgical treatment options of OSA, including the use of a custom-made dental device (which would require a referral to a specialist dentist or oral surgeon), upper airway surgical options, such as traditional UPPP or a novel less invasive surgical option in the form of Inspire hypoglossal nerve stimulation (which would involve a referral to an ENT surgeon). I also explained the CPAP treatment option to the patient, who indicated that she would be willing to try CPAP if the need arises.  I answered all her questions today and the patient was in agreement. I plan to see her back after the sleep study is completed and encouraged her to call with any interim questions, concerns, problems or updates.   Thank you very much for allowing me to participate in the care of this nice patient. If I can be of any further assistance to you please do not hesitate to call me at [phone].  Sincerely,   Star Age, MD, PhD

## 2020-03-18 NOTE — Patient Instructions (Signed)

## 2020-03-20 ENCOUNTER — Telehealth: Payer: Self-pay

## 2020-03-20 NOTE — Telephone Encounter (Signed)
LVM for pt to call me back to schedule sleep study  

## 2020-04-05 ENCOUNTER — Other Ambulatory Visit: Payer: Self-pay | Admitting: Cardiology

## 2020-04-08 ENCOUNTER — Ambulatory Visit (INDEPENDENT_AMBULATORY_CARE_PROVIDER_SITE_OTHER): Payer: Medicare PPO | Admitting: Neurology

## 2020-04-08 DIAGNOSIS — G4733 Obstructive sleep apnea (adult) (pediatric): Secondary | ICD-10-CM

## 2020-04-08 DIAGNOSIS — R351 Nocturia: Secondary | ICD-10-CM

## 2020-04-08 DIAGNOSIS — G4720 Circadian rhythm sleep disorder, unspecified type: Secondary | ICD-10-CM

## 2020-04-08 DIAGNOSIS — G4719 Other hypersomnia: Secondary | ICD-10-CM

## 2020-04-08 DIAGNOSIS — Z951 Presence of aortocoronary bypass graft: Secondary | ICD-10-CM

## 2020-04-08 DIAGNOSIS — E663 Overweight: Secondary | ICD-10-CM

## 2020-04-08 DIAGNOSIS — R0683 Snoring: Secondary | ICD-10-CM

## 2020-04-08 DIAGNOSIS — R030 Elevated blood-pressure reading, without diagnosis of hypertension: Secondary | ICD-10-CM

## 2020-04-08 DIAGNOSIS — E119 Type 2 diabetes mellitus without complications: Secondary | ICD-10-CM | POA: Diagnosis not present

## 2020-04-08 DIAGNOSIS — I25810 Atherosclerosis of coronary artery bypass graft(s) without angina pectoris: Secondary | ICD-10-CM | POA: Diagnosis not present

## 2020-04-08 DIAGNOSIS — G4700 Insomnia, unspecified: Secondary | ICD-10-CM

## 2020-04-08 DIAGNOSIS — I119 Hypertensive heart disease without heart failure: Secondary | ICD-10-CM | POA: Diagnosis not present

## 2020-04-11 NOTE — Addendum Note (Signed)
Addended by: Star Age on: 04/11/2020 12:29 PM   Modules accepted: Orders

## 2020-04-11 NOTE — Progress Notes (Signed)
Patient referred by Dr. Philip Aspen, seen by me on 03/18/20, diagnostic PSG on 04/08/20.    Please call and notify the patient that the recent sleep study showed mild obstructive sleep apnea. OSA is overall mild, but she also did not sleep very well. She had quite a bit of sleep disruption and longer. Of wakefulness. Nevertheless, I would like to offer her treatment for her mild sleep apnea in the form of autoPAP, which means, that we don't have to bring her back for a second sleep study with CPAP, but will let him try an autoPAP machine at home, through a DME company (of her choice, or as per insurance requirement). The DME representative will educate her on how to use the machine, how to put the mask on, etc. I have placed an order in the chart. Please send referral, talk to patient, send report to referring MD. We will need a FU in sleep clinic for 10 weeks post-PAP set up, please arrange that with me or one of our NPs. Thanks,   Krista Age, MD, PhD [facility])

## 2020-04-11 NOTE — Procedures (Signed)
PATIENT'S NAME:  Krista Woods, Krista Woods DOB:      1950/08/01      MR#:    [id number]     DATE OF RECORDING: 04/08/2020 REFERRING M.D.:  Leanna Battles, MD  Study Performed:   Baseline Polysomnogram HISTORY: 69 year old woman with a history of stroke, heart disease with history of MI, s/p CABG (3 vessel, 02/1999), hypertension, hyperlipidemia, and mildly overweight state, who reports snoring and excessive daytime somnolence, as well as difficulty maintaining sleep. The patient endorsed the Epworth Sleepiness Scale at 9 points. The patient's weight 145 pounds with a height of 62 (inches), resulting in a BMI of 26.8 kg/m2. The patient's neck circumference measured 13 inches.  CURRENT MEDICATIONS: Norvasc, Aspirin, Lipitor, Hydrodiuril, Toprol, Diovan   PROCEDURE:  This is a multichannel digital polysomnogram utilizing the Somnostar 11.2 system.  Electrodes and sensors were applied and monitored per AASM Specifications.   EEG, EOG, Chin and Limb EMG, were sampled at 200 Hz.  ECG, Snore and Nasal Pressure, Thermal Airflow, Respiratory Effort, CPAP Flow and Pressure, Oximetry was sampled at 50 Hz. Digital video and audio were recorded.      BASELINE STUDY  Lights Out was at 20:43 and Lights On at 04:59.  Total recording time (TRT) was 496.5 minutes, with a total sleep time (TST) of 270.5 minutes.   The patient's sleep latency was 65.5 minutes. REM latency was 113.5 minutes. The sleep efficiency was 54.5%, which is reduced.     SLEEP ARCHITECTURE: WASO (Wake after sleep onset) was 199.5 minutes with significant sleep fragmentation noted and several longer periods of wakefulness. There were 52 minutes in Stage N1, 61 minutes Stage N2, 111.5 minutes Stage N3 and 46 minutes in Stage REM.  The percentage of Stage N1 was 19.2%, which is markedly increased, Stage N2 was 22.6%, which is reduced, Stage N3 was 41.2%, which is increased, and Stage R (REM sleep) was 17.%, which is mildly reduced. The arousals were noted as: 91  were spontaneous, 0 were associated with PLMs, 23 were associated with respiratory events.  RESPIRATORY ANALYSIS:  There were a total of 31 respiratory events:  8 obstructive apneas, 0 central apneas and 0 mixed apneas with a total of 8 apneas and an apnea index (AI) of 1.8 /hour. There were 23 hypopneas with a hypopnea index of 5.1 /hour. The patient also had 0 respiratory event related arousals (RERAs).      The total APNEA/HYPOPNEA INDEX (AHI) was 6.9/hour and the total RESPIRATORY DISTURBANCE INDEX was  6.9 /hour.  4 events occurred in REM sleep and 38 events in NREM. The REM AHI was  5.2 /hour, versus a non-REM AHI of 7.2. The patient spent 138 minutes of total sleep time in the supine position and 133 minutes in non-supine.. The supine AHI was 11.3 versus a non-supine AHI of 2.3.  OXYGEN SATURATION & C02:  The Wake baseline 02 saturation was 96%, with the lowest being 89%. Time spent below 89% saturation equaled 0 minutes.  PERIODIC LIMB MOVEMENTS: The patient had a total of 0 Periodic Limb Movements.  The Periodic Limb Movement (PLM) index was 0 and the PLM Arousal index was 0/hour.  Audio and video analysis did not show any abnormal or unusual movements, behaviors, phonations or vocalizations. The patient took 1 bathroom break. Mild, intermittent snoring was noted. The EKG was in keeping with normal sinus rhythm (NSR).  Post-study, the patient indicated that sleep was better than usual.   IMPRESSION:  1. Obstructive Sleep Apnea (OSA) 2. Dysfunctions  associated with sleep stages or arousal from sleep  RECOMMENDATIONS:  1. This study demonstrates overall mild obstructive sleep apnea with a total AHI of 6.9/hour, and O2 nadir of 89%. The study was somewhat limited by reduced sleep efficiency and poor sleep consolidation. Given the patient's medical history and sleep related complaints, treatment with positive airway pressure is recommended; this can be achieved in the form of autoPAP.  Alternatively, a full-night CPAP titration study would allow optimization of therapy, if needed down the road. Other treatment options may include avoidance of supine sleep position along with weight loss, upper airway or jaw surgery in selected patients or the use of an oral appliance in certain patients. ENT evaluation and/or consultation with a maxillofacial surgeon or dentist may be feasible in some instances.    2. Please note that untreated obstructive sleep apnea may carry additional perioperative morbidity. Patients with significant obstructive sleep apnea should receive perioperative PAP therapy and the surgeons and particularly the anesthesiologist should be informed of the diagnosis and the severity of the sleep disordered breathing. 3. This study shows sleep fragmentation and abnormal sleep stage percentages; these are nonspecific findings and per se do not signify an intrinsic sleep disorder or a cause for the patient's sleep-related symptoms. Causes include (but are not limited to) the first night effect of the sleep study, circadian rhythm disturbances, medication effect or an underlying mood disorder or medical problem.  4. The patient should be cautioned not to drive, work at heights, or operate dangerous or heavy equipment when tired or sleepy. Review and reiteration of good sleep hygiene measures should be pursued with any patient. 5. The patient will be seen in follow-up by Dr. Rexene Alberts at [facility] for discussion of the test results and further management strategies. The referring provider will be notified of the test results.  I certify that I have reviewed the entire raw data recording prior to the issuance of this report in accordance with the Standards of Accreditation of the American Academy of Sleep Medicine (AASM)   Star Age, MD, PhD Diplomat, American Board of Neurology and Sleep Medicine (Neurology and Sleep Medicine)

## 2020-04-14 ENCOUNTER — Telehealth: Payer: Self-pay

## 2020-04-14 NOTE — Telephone Encounter (Signed)
-----   Message from Star Age, MD sent at 04/11/2020 12:29 PM EDT ----- Patient referred by Dr. Philip Aspen, seen by me on 03/18/20, diagnostic PSG on 04/08/20.    Please call and notify the patient that the recent sleep study showed mild obstructive sleep apnea. OSA is overall mild, but she also did not sleep very well. She had quite a bit of sleep disruption and longer. Of wakefulness. Nevertheless, I would like to offer her treatment for her mild sleep apnea in the form of autoPAP, which means, that we don't have to bring her back for a second sleep study with CPAP, but will let him try an autoPAP machine at home, through a DME company (of her choice, or as per insurance requirement). The DME representative will educate her on how to use the machine, how to put the mask on, etc. I have placed an order in the chart. Please send referral, talk to patient, send report to referring MD. We will need a FU in sleep clinic for 10 weeks post-PAP set up, please arrange that with me or one of our NPs. Thanks,   Star Age, MD, PhD [facility])

## 2020-04-14 NOTE — Telephone Encounter (Signed)
I called pt to discuss. No answer, left a message asking her to call me back. 

## 2020-04-15 NOTE — Telephone Encounter (Signed)
I called Krista Woods. I advised Krista Woods that Dr. Rexene Alberts reviewed their sleep study results and found that Krista Woods has mild osa. Dr. Rexene Alberts recommends that Krista Woods start an auto pap at home. I reviewed PAP compliance expectations with the Krista Woods. Krista Woods is agreeable to starting an auto-PAP. I advised Krista Woods that an order will be sent to a DME, [facility], and [facility] will call the Krista Woods within about one week after they file with the Krista Woods's insurance. [facility] will show the Krista Woods how to use the machine, fit for masks, and troubleshoot the auto-PAP if needed. A follow up appt was made for insurance purposes with Amy, NP on 07/21/20 at 11:00am. Krista Woods verbalized understanding to arrive 15 minutes early and bring their auto-PAP. A letter with all of this information in it will be mailed to the Krista Woods as a reminder. I verified with the Krista Woods that the address we have on file is correct. Krista Woods verbalized understanding of results. Krista Woods had no questions at this time but was encouraged to call back if questions arise. I have sent the order to [facility] and have received confirmation that they have received the order.

## 2020-05-07 DIAGNOSIS — F5101 Primary insomnia: Secondary | ICD-10-CM | POA: Diagnosis not present

## 2020-05-07 DIAGNOSIS — I119 Hypertensive heart disease without heart failure: Secondary | ICD-10-CM | POA: Diagnosis not present

## 2020-05-16 DIAGNOSIS — G4733 Obstructive sleep apnea (adult) (pediatric): Secondary | ICD-10-CM | POA: Diagnosis not present

## 2020-06-15 DIAGNOSIS — G4733 Obstructive sleep apnea (adult) (pediatric): Secondary | ICD-10-CM | POA: Diagnosis not present

## 2020-07-04 ENCOUNTER — Other Ambulatory Visit: Payer: Self-pay | Admitting: Cardiology

## 2020-07-16 DIAGNOSIS — G4733 Obstructive sleep apnea (adult) (pediatric): Secondary | ICD-10-CM | POA: Diagnosis not present

## 2020-07-21 ENCOUNTER — Other Ambulatory Visit: Payer: Self-pay

## 2020-07-21 ENCOUNTER — Encounter: Payer: Self-pay | Admitting: Family Medicine

## 2020-07-21 ENCOUNTER — Ambulatory Visit: Payer: Medicare PPO | Admitting: Family Medicine

## 2020-07-21 VITALS — BP 154/78 | HR 74 | Ht 62.0 in | Wt 142.0 lb

## 2020-07-21 DIAGNOSIS — G4733 Obstructive sleep apnea (adult) (pediatric): Secondary | ICD-10-CM | POA: Diagnosis not present

## 2020-07-21 DIAGNOSIS — Z9989 Dependence on other enabling machines and devices: Secondary | ICD-10-CM

## 2020-07-21 NOTE — Progress Notes (Signed)
Faxed sent to [facility] at [phone] regarding new CPAP orders, fax sent conformation.

## 2020-07-21 NOTE — Patient Instructions (Signed)
Please continue using your CPAP regularly. While your insurance requires that you use CPAP at least 4 hours each night on 70% of the nights, I recommend, that you not skip any nights and use it throughout the night if you can. Getting used to CPAP and staying with the treatment long term does take time and patience and discipline. Untreated obstructive sleep apnea when it is moderate to severe can have an adverse impact on cardiovascular health and raise her risk for heart disease, arrhythmias, hypertension, congestive heart failure, stroke and diabetes. Untreated obstructive sleep apnea causes sleep disruption, nonrestorative sleep, and sleep deprivation. This can have an impact on your day to day functioning and cause daytime sleepiness and impairment of cognitive function, memory loss, mood disturbance, and problems focussing. Using CPAP regularly can improve these symptoms.   I will send an order to your supply company for a chin strap. This will help decrease air coming out of your mouth. Follow up in 3 months   Sleep Apnea Sleep apnea affects breathing during sleep. It causes breathing to stop for a short time or to become shallow. It can also increase the risk of:  Heart attack.  Stroke.  Being very overweight (obese).  Diabetes.  Heart failure.  Irregular heartbeat. The goal of treatment is to help you breathe normally again. What are the causes? There are three kinds of sleep apnea:  Obstructive sleep apnea. This is caused by a blocked or collapsed airway.  Central sleep apnea. This happens when the brain does not send the right signals to the muscles that control breathing.  Mixed sleep apnea. This is a combination of obstructive and central sleep apnea. The most common cause of this condition is a collapsed or blocked airway. This can happen if:  Your throat muscles are too relaxed.  Your tongue and tonsils are too large.  You are overweight.  Your airway is too  small.   What increases the risk?  Being overweight.  Smoking.  Having a small airway.  Being older.  Being female.  Drinking alcohol.  Taking medicines to calm yourself (sedatives or tranquilizers).  Having family members with the condition. What are the signs or symptoms?  Trouble staying asleep.  Being sleepy or tired during the day.  Getting angry a lot.  Loud snoring.  Headaches in the morning.  Not being able to focus your mind (concentrate).  Forgetting things.  Less interest in sex.  Mood swings.  Personality changes.  Feelings of sadness (depression).  Waking up a lot during the night to pee (urinate).  Dry mouth.  Sore throat. How is this diagnosed?  Your medical history.  A physical exam.  A test that is done when you are sleeping (sleep study). The test is most often done in a sleep lab but may also be done at home. How is this treated?  Sleeping on your side.  Using a medicine to get rid of mucus in your nose (decongestant).  Avoiding the use of alcohol, medicines to help you relax, or certain pain medicines (narcotics).  Losing weight, if needed.  Changing your diet.  Not smoking.  Using a machine to open your airway while you sleep, such as: ? An oral appliance. This is a mouthpiece that shifts your lower jaw forward. ? A CPAP device. This device blows air through a mask when you breathe out (exhale). ? An EPAP device. This has valves that you put in each nostril. ? A BPAP device. This device  blows air through a mask when you breathe in (inhale) and breathe out.  Having surgery if other treatments do not work. It is important to get treatment for sleep apnea. Without treatment, it can lead to:  High blood pressure.  Coronary artery disease.  In men, not being able to have an erection (impotence).  Reduced thinking ability.   Follow these instructions at home: Lifestyle  Make changes that your doctor recommends.  Eat  a healthy diet.  Lose weight if needed.  Avoid alcohol, medicines to help you relax, and some pain medicines.  Do not use any products that contain nicotine or tobacco, such as cigarettes, e-cigarettes, and chewing tobacco. If you need help quitting, ask your doctor. General instructions  Take over-the-counter and prescription medicines only as told by your doctor.  If you were given a machine to use while you sleep, use it only as told by your doctor.  If you are having surgery, make sure to tell your doctor you have sleep apnea. You may need to bring your device with you.  Keep all follow-up visits as told by your doctor. This is important. Contact a doctor if:  The machine that you were given to use during sleep bothers you or does not seem to be working.  You do not get better.  You get worse. Get help right away if:  Your chest hurts.  You have trouble breathing in enough air.  You have an uncomfortable feeling in your back, arms, or stomach.  You have trouble talking.  One side of your body feels weak.  A part of your face is hanging down. These symptoms may be an emergency. Do not wait to see if the symptoms will go away. Get medical help right away. Call your local emergency services (911 in the [place] Do not drive yourself to the hospital. Summary  This condition affects breathing during sleep.  The most common cause is a collapsed or blocked airway.  The goal of treatment is to help you breathe normally while you sleep. This information is not intended to replace advice given to you by your health care provider. Make sure you discuss any questions you have with your health care provider. Document Revised: 03/31/2018 Document Reviewed: 02/07/2018 Elsevier Patient Education  2021 [employer].   CPAP and BPAP Information CPAP and BPAP are methods that use air pressure to keep your airways open and to help you breathe well. CPAP and BPAP use different amounts  of pressure. Your health care provider will tell you whether CPAP or BPAP would be more helpful for you.  CPAP stands for "continuous positive airway pressure." With CPAP, the amount of pressure stays the same while you breathe in and out.  BPAP stands for "bi-level positive airway pressure." With BPAP, the amount of pressure will be higher when you breathe in (inhale) and lower when you breathe out(exhale). This allows you to take larger breaths. CPAP or BPAP may be used in the hospital, or your health care provider may want you to use it at home. You may need to have a sleep study before your health care provider can order a machine for you to use at home. Why are CPAP and BPAP treatments used? CPAP or BPAP can be helpful if you have:  Sleep apnea.  Chronic obstructive pulmonary disease (COPD).  Heart failure.  Medical conditions that cause muscle weakness, including muscular dystrophy or amyotrophic lateral sclerosis (ALS).  Other problems that cause breathing to be  shallow, weak, abnormal, or difficult. CPAP and BPAP are most commonly used for obstructive sleep apnea (OSA) to keep the airways from collapsing when the muscles relax during sleep. How is CPAP or BPAP administered? Both CPAP and BPAP are provided by a small machine with a flexible plastic tube that attaches to a plastic mask that you wear. Air is blown through the mask into your nose or mouth. The amount of pressure that is used to blow the air can be adjusted on the machine. Your health care provider will set the pressure setting and help you find the best mask for you. When should CPAP or BPAP be used? In most cases, the mask only needs to be worn during sleep. Generally, the mask needs to be worn throughout the night and during any daytime naps. People with certain medical conditions may also need to wear the mask at other times when they are awake. Follow instructions from your health care provider about when to use the  machine. What are some tips for using the mask?  Because the mask needs to be snug, some people feel trapped or closed-in (claustrophobic) when first using the mask. If you feel this way, you may need to get used to the mask. One way to do this is to hold the mask loosely over your nose or mouth and then gradually apply the mask more snugly. You can also gradually increase the amount of time that you use the mask.  Masks are available in various types and sizes. If your mask does not fit well, talk with your health care provider about getting a different one. Some common types of masks include: ? Full face masks, which fit over the mouth and nose. ? Nasal masks, which fit over the nose. ? Nasal pillow or prong masks, which fit into the nostrils.  If you are using a mask that fits over your nose and you tend to breathe through your mouth, a chin strap may be applied to help keep your mouth closed.  Some CPAP and BPAP machines have alarms that may sound if the mask comes off or develops a leak.  If you have trouble with the mask, it is very important that you talk with your health care provider about finding a way to make the mask easier to tolerate. Do not stop using the mask. There could be a negative impact to your health if you stop using the mask.   What are some tips for using the machine?  Place your CPAP or BPAP machine on a secure table or stand near an electrical outlet.  Know where the on/off switch is on the machine.  Follow instructions from your health care provider about how to set the pressure on your machine and when you should use it.  Do not eat or drink while the CPAP or BPAP machine is on. Food or fluids could get pushed into your lungs by the pressure of the CPAP or BPAP.  For home use, CPAP and BPAP machines can be rented or purchased through home health care companies. Many different brands of machines are available. Renting a machine before purchasing may help you find  out which particular machine works well for you. Your insurance may also decide which machine you may get.  Keep the CPAP or BPAP machine and attachments clean. Ask your health care provider for specific instructions. Follow these instructions at home:  Do not use any products that contain nicotine or tobacco, such as cigarettes,  e-cigarettes, and chewing tobacco. If you need help quitting, ask your health care provider.  Keep all follow-up visits as told by your health care provider. This is important. Contact a health care provider if:  You have redness or pressure sores on your head, face, mouth, or nose from the mask or head gear.  You have trouble using the CPAP or BPAP machine.  You cannot tolerate wearing the CPAP or BPAP mask.  Someone tells you that you snore even when wearing your CPAP or BPAP. Get help right away if:  You have trouble breathing.  You feel confused. Summary  CPAP and BPAP are methods that use air pressure to keep your airways open and to help you breathe well.  You may need to have a sleep study before your health care provider can order a machine for home use.  If you have trouble with the mask, it is very important that you talk with your health care provider about finding a way to make the mask easier to tolerate. Do not stop using the mask. There could be a negative impact to your health if you stop using the mask.  Follow instructions from your health care provider about when to use the machine. This information is not intended to replace advice given to you by your health care provider. Make sure you discuss any questions you have with your health care provider. Document Revised: 07/06/2019 Document Reviewed: 07/09/2019 Elsevier Patient Education  2021 [employer].

## 2020-07-21 NOTE — Progress Notes (Addendum)
PATIENT: Krista Woods DOB: 12-06-1950  REASON FOR VISIT: follow up HISTORY FROM: patient  Chief Complaint  Patient presents with  . Follow-up    RM 1 alone Pt is well, not sleeping great with CPAP     HISTORY OF PRESENT ILLNESS: Today 07/21/20 Krista Woods is a 70 y.o. female here today for follow up for OSA on CPAP. Nocturnal polysonography 04/08/2020 showed "overall mild obstructive sleep apnea with a total AHI of 6.9/hour, and O2 nadir of 89%". Study was limited by reduced sleep efficiency and poor sleep consolidation. With medical history, autopap was recommended.  She reports that she is used CPAP fairly  consistently over the past 3 months.  She does not love to use CPAP.  She notes that air seems to be leaking from her mask.  She feels that air is coming from her mouth when she breathes at night.  She often wakes with a dry mouth.  She is currently using a nasal pillow.  She does note that she is sleeping better.  She feels more energized throughout the day.  She is no longer needing to take daytime naps.  She was hopeful that she could stop CPAP therapy after 3 months of consistent use.   Compliance report dated 06/17/2020 through 07/16/2020 reveals that she used CPAP 25 of the past 30 days for compliance of 83%.  She used CPAP greater than 4 hours 23 of the past 30 days for compliance of 77%.  Average use on days used was 4 hours and 9 minutes.  Residual AHI was 1.9 on 5 to 11 cm of water.  There was  a leak noted in the 95th percentile of 25.3 L/min.   HISTORY: (copied from Dr Guadelupe Sabin previous note)  Dear Dr. Philip Aspen,   I saw your patient, Krista Woods, upon your kind request in my sleep clinic today for initial consultation of her sleep disorder, in particular, with difficulty initiating and maintaining sleep.  The patient is unaccompanied today.  As you know, Krista Woods is a 70 year old right-handed woman with an underlying medical history of stroke, heart disease with history of  MI, s/p CABG (3 vessel, 02/1999), hypertension, hyperlipidemia, and mildly overweight state, who reports snoring and excessive daytime somnolence, as well as difficulty maintaining sleep.  She has some difficulty initiating sleep as well.  I reviewed your office note from 02/07/2020.  Her Epworth sleepiness score is 9 out of 24, fatigue severity score is 46 out of 63.  She lives alone.  She has 2 grown daughters, one lives in [place], the other in [place].  She has no family history of sleep apnea that she knows of.  She had tonsillectomy at age 21 years old.  She has nocturia about 3 times per average night, she tries to be in bed around 830 but watches TV in bed.  Often the TV stays on, sometimes she turns it up when she wakes up in the middle of the night.  She typically gets up around 5 but sometimes she is already up around 2 or 3 AM.  She is a non-smoker.  She does not drink alcohol.  She drinks caffeine in limitation, in the form of soda, 1/day on average.  She denies recurrent morning headaches.  She retired at age 31.  Her blood pressure numbers have been elevated.  She checks it at home as well.  She is on 4 different blood pressure medications and has had difficult to control blood  pressure.  She has never had a sleep study but would be willing to get checked out, to see if sleep apnea could be a contributor for her difficult to control blood pressure.    REVIEW OF SYSTEMS: Out of a complete 14 system review of symptoms, the patient complains only of the following symptoms, fatigue and all other reviewed systems are negative.  ESS: 3 FSS: 9  ALLERGIES: No Known Allergies  HOME MEDICATIONS: Outpatient Medications Prior to Visit  Medication Sig Dispense Refill  . amLODipine (NORVASC) 5 MG tablet Take 5 mg by mouth daily.    [name] Kitchen aspirin 81 MG tablet Take 81 mg by mouth daily.    [name] Kitchen atorvastatin (LIPITOR) 80 MG tablet TAKE 1 TABLET(80 MG) BY MOUTH DAILY AT 6 PM 90 tablet 1  .  hydrochlorothiazide (HYDRODIURIL) 25 MG tablet TAKE 1 TABLET(25 MG) BY MOUTH DAILY 30 tablet 3  . metoprolol succinate (TOPROL-XL) 50 MG 24 hr tablet TAKE 1 TABLET(50 MG) BY MOUTH DAILY 90 tablet 2  . Multiple Vitamin (MULTIVITAMIN) capsule Take 1 capsule by mouth daily.    . valsartan (DIOVAN) 160 MG tablet Take 1 tablet (160 mg total) by mouth daily. 90 tablet 1   No facility-administered medications prior to visit.    PAST MEDICAL HISTORY: Past Medical History:  Diagnosis Date  . Coronary disease    past CABG in September 2000 with LIMA to LAD, SVG to 1st DX, SVG to LCX  . HTN (hypertension)   . Hyperlipidemia   . Myocardial infarction ([place]) 02/1999   triple bypass surg  . Stroke [facility])    prior to open heart surgery    PAST SURGICAL HISTORY: Past Surgical History:  Procedure Laterality Date  . CARDIAC CATHETERIZATION  02/26/1999   ef 65%  . CARDIOVASCULAR STRESS TEST  09/23/2008   EF 71%, NORMAL.  [name] Kitchen CORONARY ARTERY BYPASS GRAFT  September 2000   This included an LIMA graft to the LAD, saphenous vein graft to the first diagonal, saphenous vein graft to the circumflex    FAMILY HISTORY: Family History  Problem Relation Age of Onset  . Heart attack Mother 20  . Heart attack Sister 71       MI  . Cerebral aneurysm Daughter   . Diabetes Brother   . Heart disease Brother   . Diabetes Brother   . Colon cancer Neg Hx   . Esophageal cancer Neg Hx   . Stomach cancer Neg Hx   . Rectal cancer Neg Hx     SOCIAL HISTORY: Social History   Socioeconomic History  . Marital status: Widowed    Spouse name: Not on file  . Number of children: 2  . Years of education: Not on file  . Highest education level: Not on file  Occupational History  . Occupation: school system    Comment: retired  Tobacco Use  . Smoking status: Never Smoker  . Smokeless tobacco: Never Used  Substance and Sexual Activity  . Alcohol use: No  . Drug use: No  . Sexual activity: Never  Other Topics  Concern  . Not on file  Social History Narrative  . Not on file   Social Determinants of Health   Financial Resource Strain: Not on file  Food Insecurity: Not on file  Transportation Needs: Not on file  Physical Activity: Not on file  Stress: Not on file  Social Connections: Not on file  Intimate Partner Violence: Not on file     PHYSICAL EXAM  Vitals:   07/21/20 1046  BP: (!) 154/78  Pulse: 74  Weight: 142 lb (64.4 kg)  Height: 5\' 2"  (1.575 m)   Body mass index is 25.97 kg/m.  Generalized: Well developed, in no acute distress  Cardiology: normal rate and rhythm, no murmur noted Respiratory: clear to auscultation bilaterally  Neurological examination  Mentation: Alert oriented to time, place, history taking. Follows all commands speech and language fluent Cranial nerve II-XII: Pupils were equal round reactive to light. Extraocular movements were full, visual field were full  Motor: The motor testing reveals 5 over 5 strength of all 4 extremities. Good symmetric motor tone is noted throughout.  Gait and station: Gait is normal.    DIAGNOSTIC DATA (LABS, IMAGING, TESTING) - I reviewed patient records, labs, notes, testing and imaging myself where available.  No flowsheet data found.   Lab Results  Component Value Date   WBC 6.7 03/08/2014   HGB 13.8 03/08/2014   HCT 39.7 03/08/2014   MCV 89.6 03/08/2014   PLT 270 03/08/2014      Component Value Date/Time   NA 140 01/28/2017 1110   K 4.0 01/28/2017 1110   CL 98 01/28/2017 1110   CO2 28 01/28/2017 1110   GLUCOSE 115 (H) 01/28/2017 1110   GLUCOSE 125 (H) 02/26/2016 1422   BUN 13 01/28/2017 1110   CREATININE 0.78 01/28/2017 1110   CREATININE 0.76 02/26/2016 1422   CALCIUM 9.2 01/28/2017 1110   PROT 6.4 01/28/2017 1110   ALBUMIN 4.1 01/28/2017 1110   AST 15 01/28/2017 1110   ALT 13 01/28/2017 1110   ALKPHOS 69 01/28/2017 1110   BILITOT 0.4 01/28/2017 1110   GFRNONAA 79 01/28/2017 1110   GFRAA 92  01/28/2017 1110   Lab Results  Component Value Date   CHOL 133 01/28/2017   HDL 65 01/28/2017   LDLCALC 60 01/28/2017   TRIG 40 01/28/2017   CHOLHDL 2.0 01/28/2017   No results found for: HGBA1C No results found for: VITAMINB12 No results found for: TSH   ASSESSMENT AND PLAN 70 y.o. year old female  has a past medical history of Coronary disease, HTN (hypertension), Hyperlipidemia, Myocardial infarction ([place]) (02/1999), and Stroke ([place]). here with     ICD-10-CM   1. OSA on CPAP  G47.33 For home use only DME continuous positive airway pressure (CPAP)   Z99.89      ERENDIDA WRENN is doing fairly well on CPAP therapy.  Although she does not like to use her CPAP and was under the impression she would only need to use it for 3 months, she does note improvement in sleep quality and daytime energy.  After review of sleep study as well as risk of untreated sleep apnea, she does agree to continue using CPAP therapy. I will ask the DME company to provide a chinstrap to see if this will help with air leak. She was encouraged to continue using CPAP nightly and for greater than 4 hours each night. Risks of untreated sleep apnea review and education materials provided.  We have discussed consideration of dental appliance versus inspire device if unable to tolerate CPAP therapy.  She is not interested in either of the 2 options at this time.  Healthy lifestyle habits encouraged. She will follow up in 3 months, sooner if needed. She verbalizes understanding and agreement with this plan.   Orders Placed This Encounter  Procedures  . For home use only DME continuous positive airway pressure (CPAP)    Needs chin  strap please    Order Specific Question:   Length of Need    Answer:   Lifetime    Order Specific Question:   Patient has OSA or probable OSA    Answer:   Yes    Order Specific Question:   Is the patient currently using CPAP in the home    Answer:   Yes    Order Specific Question:   Settings     Answer:   Other see comments    Order Specific Question:   CPAP supplies needed    Answer:   Mask, headgear, cushions, filters, heated tubing and water chamber     No orders of the defined types were placed in this encounter.     I spent 15 minutes with the patient. 50% of this time was spent counseling and educating patient on plan of care and medications.    Debbora Presto, FNP-C 07/21/2020, 12:36 PM [facility] [address], [place], [place] [address] [phone]   I reviewed the above note and documentation by the Nurse Practitioner and agree with the history, exam, assessment and plan as outlined above. I was available for consultation. Star Age, MD, PhD [facility])

## 2020-08-16 DIAGNOSIS — G4733 Obstructive sleep apnea (adult) (pediatric): Secondary | ICD-10-CM | POA: Diagnosis not present

## 2020-09-06 NOTE — Progress Notes (Unsigned)
Krista Woods Date of Birth: 04/08/1951 Medical Record #[id number]  History of Present Illness: Krista Woods is seen for follow up of CAD and HTN.  Has known CAD with remote CABG in 2000. Other issues include HTN and HLD. Negative Myoview August 2016. She also has a strong family history of vascular disease. Her sister and 2 brothers have had an MI and daughter had a cerebral aneurysm. Another sister had a major stroke.  In 2019  she was diagnosed with heme positive stool.  She underwent upper EGD by Dr. Purnell Shoemaker in January 2020, this revealed gastritis and small hiatal hernia. Her aspirin was decreased from 325mg  to 81mg  daily.   On follow up today she feels great. No chest pain or dyspnea. Is exercising getting 13K steps a day. Notes BP has been doing OK but ate out yesterday and it was very salty.    Current Outpatient Medications  Medication Sig Dispense Refill  . amLODipine (NORVASC) 5 MG tablet Take 5 mg by mouth daily.    [name] Kitchen atorvastatin (LIPITOR) 80 MG tablet TAKE 1 TABLET(80 MG) BY MOUTH DAILY AT 6 PM 90 tablet 1  . hydrochlorothiazide (HYDRODIURIL) 25 MG tablet TAKE 1 TABLET(25 MG) BY MOUTH DAILY 30 tablet 3  . losartan (COZAAR) 100 MG tablet Take 100 mg by mouth daily.    . metoprolol succinate (TOPROL-XL) 50 MG 24 hr tablet TAKE 1 TABLET(50 MG) BY MOUTH DAILY 90 tablet 2  . Multiple Vitamin (MULTIVITAMIN) capsule Take 1 capsule by mouth daily.     No current facility-administered medications for this visit.    No Known Allergies  Past Medical History:  Diagnosis Date  . Coronary disease    past CABG in September 2000 with LIMA to LAD, SVG to 1st DX, SVG to LCX  . HTN (hypertension)   . Hyperlipidemia   . Myocardial infarction ([place]) 02/1999   triple bypass surg  . Stroke [facility])    prior to open heart surgery    Past Surgical History:  Procedure Laterality Date  . CARDIAC CATHETERIZATION  02/26/1999   ef 65%  . CARDIOVASCULAR STRESS TEST  09/23/2008   EF 71%, NORMAL.  [name] Kitchen  CORONARY ARTERY BYPASS GRAFT  September 2000   This included an LIMA graft to the LAD, saphenous vein graft to the first diagonal, saphenous vein graft to the circumflex    Social History   Tobacco Use  Smoking Status Never Smoker  Smokeless Tobacco Never Used    Social History   Substance and Sexual Activity  Alcohol Use No    Family History  Problem Relation Age of Onset  . Heart attack Mother 28  . Heart attack Sister 9       MI  . Cerebral aneurysm Daughter   . Diabetes Brother   . Heart disease Brother   . Diabetes Brother   . Colon cancer Neg Hx   . Esophageal cancer Neg Hx   . Stomach cancer Neg Hx   . Rectal cancer Neg Hx     Review of Systems: The review of systems is per the HPI.  All other systems were reviewed and are negative.  Physical Exam: BP (!) 150/83   Pulse 67   Ht 5\' 2"  (1.575 m)   Wt 138 lb 12.8 oz (63 kg)   SpO2 97%   BMI 25.39 kg/m  GENERAL:  Well appearing BF in NAD HEENT:  PERRL, EOMI, sclera are clear. Oropharynx is clear. NECK:  No jugular venous  distention, carotid upstroke brisk and symmetric, no bruits, no thyromegaly or adenopathy LUNGS:  Clear to auscultation bilaterally CHEST:  Unremarkable HEART:  RRR,  PMI not displaced or sustained,S1 and S2 within normal limits, no S3, no S4: no clicks, no rubs, no murmurs ABD:  Soft, nontender. BS +, no masses or bruits. No hepatomegaly, no splenomegaly EXT:  2 + pulses throughout, no edema, no cyanosis no clubbing SKIN:  Warm and dry.  No rashes NEURO:  Alert and oriented x 3. Cranial nerves II through XII intact. PSYCH:  Cognitively intact    LABORATORY DATA: Lab Results  Component Value Date   WBC 6.7 03/08/2014   HGB 13.8 03/08/2014   HCT 39.7 03/08/2014   PLT 270 03/08/2014   GLUCOSE 115 (H) 01/28/2017   CHOL 133 01/28/2017   TRIG 40 01/28/2017   HDL 65 01/28/2017   LDLCALC 60 01/28/2017   ALT 13 01/28/2017   AST 15 01/28/2017   NA 140 01/28/2017   K 4.0 01/28/2017    CL 98 01/28/2017   CREATININE 0.78 01/28/2017   BUN 13 01/28/2017   CO2 28 01/28/2017   Dated 01/31/20: cholesterol 149, triglycerides 44, HDL 71, LDL 69. A1c 6.2%. CMET and TSH normal.  Ecg today: NSR rate 67. Normal. I have personally reviewed and interpreted this study.   Myoview: 02/05/15: Study Highlights     The left ventricular ejection fraction is normal (55-65%).  Nuclear stress EF: 65%.  Blood pressure demonstrated a hypertensive response to exercise.  Downsloping ST segment depression ST segment depression of 3 mm was noted during stress in the II, III, aVF, V5, V6 and V4 leads, beginning at 2 minutes of stress, and returning to baseline after 1-5 minutes of recovery.  Defect 1: There is a small defect of mild severity.  This is a low risk study.  Low risk study. Small, mild fixed inferoapical and apical lateral defect which is likely artifact. No reversible ischemia. LVEF 65% with normal wall motion. There was significant 3 mm downsloping ST depression inferiorly and laterally on the stress EKG with exercise, however, there is voltage criteria for LVH, baseline wander is noted and this could represent repolarization abnormality or ischemia. Exercise tolerance was fair with no chest pain. There was a marked hypertensive response to exercise.     Assessment / Plan: 1. CAD - remote CABG in 2000 - negative Myoview in August 2016. asymptomatic. Continue current medical therapy.  2. HTN - elevated BP today ? Related to high sodium intake yesterday. On multiple medications. Will monitor BP at home and let us know if it is staying high.   3. HLD - excellent control- continue lipitor.   4. Prediabetes. Last A1c 6.2%. dietary therapy  I will follow up in 6 months

## 2020-09-10 ENCOUNTER — Other Ambulatory Visit: Payer: Self-pay

## 2020-09-10 ENCOUNTER — Encounter: Payer: Self-pay | Admitting: Cardiology

## 2020-09-10 ENCOUNTER — Ambulatory Visit (INDEPENDENT_AMBULATORY_CARE_PROVIDER_SITE_OTHER): Payer: Medicare PPO | Admitting: Cardiology

## 2020-09-10 VITALS — BP 150/83 | HR 67 | Ht 62.0 in | Wt 138.8 lb

## 2020-09-10 DIAGNOSIS — I10 Essential (primary) hypertension: Secondary | ICD-10-CM | POA: Diagnosis not present

## 2020-09-10 DIAGNOSIS — E119 Type 2 diabetes mellitus without complications: Secondary | ICD-10-CM

## 2020-09-10 DIAGNOSIS — I25810 Atherosclerosis of coronary artery bypass graft(s) without angina pectoris: Secondary | ICD-10-CM | POA: Diagnosis not present

## 2020-09-10 DIAGNOSIS — E785 Hyperlipidemia, unspecified: Secondary | ICD-10-CM

## 2020-09-13 DIAGNOSIS — G4733 Obstructive sleep apnea (adult) (pediatric): Secondary | ICD-10-CM | POA: Diagnosis not present

## 2020-09-24 ENCOUNTER — Telehealth: Payer: Self-pay | Admitting: Family Medicine

## 2020-09-24 NOTE — Telephone Encounter (Signed)
Please let her know that I have reviewed her download and it looks like she has not used CPAP recently. Was she able to get the chin strap? Any other concerns with machine or supplies? Remind her to continue close follow up with DME if she is having trouble.

## 2020-09-24 NOTE — Telephone Encounter (Signed)
Reached out to pt and LVM per DPR, informing her that Amy Ubaldo Glassing has reviewed her download and it looks like she has not used CPAP recently. I asked was she able to get the chin strap, Any other concerns with machine or supplies? Advised her to follow up with DME if she is having trouble or has not received strap yet and to call the office with further questions.

## 2020-10-27 ENCOUNTER — Other Ambulatory Visit: Payer: Self-pay | Admitting: Cardiology

## 2020-11-05 ENCOUNTER — Ambulatory Visit: Payer: Medicare PPO | Admitting: Family Medicine

## 2021-01-14 DIAGNOSIS — Z20822 Contact with and (suspected) exposure to covid-19: Secondary | ICD-10-CM | POA: Diagnosis not present

## 2021-01-23 ENCOUNTER — Ambulatory Visit
Admission: RE | Admit: 2021-01-23 | Discharge: 2021-01-23 | Disposition: A | Discharge status: Home / Self Care | Payer: Medicare PPO | Source: Ambulatory Visit | Attending: Internal Medicine | Admitting: Internal Medicine

## 2021-01-23 ENCOUNTER — Other Ambulatory Visit: Payer: Self-pay | Admitting: Internal Medicine

## 2021-01-23 ENCOUNTER — Other Ambulatory Visit: Payer: Self-pay

## 2021-01-23 DIAGNOSIS — Z1231 Encounter for screening mammogram for malignant neoplasm of breast: Secondary | ICD-10-CM

## 2021-02-25 DIAGNOSIS — H3561 Retinal hemorrhage, right eye: Secondary | ICD-10-CM | POA: Diagnosis not present

## 2021-03-04 DIAGNOSIS — E119 Type 2 diabetes mellitus without complications: Secondary | ICD-10-CM | POA: Diagnosis not present

## 2021-03-04 DIAGNOSIS — E785 Hyperlipidemia, unspecified: Secondary | ICD-10-CM | POA: Diagnosis not present

## 2021-03-06 DIAGNOSIS — G4733 Obstructive sleep apnea (adult) (pediatric): Secondary | ICD-10-CM | POA: Diagnosis not present

## 2021-03-06 DIAGNOSIS — Z1331 Encounter for screening for depression: Secondary | ICD-10-CM | POA: Diagnosis not present

## 2021-03-06 DIAGNOSIS — E785 Hyperlipidemia, unspecified: Secondary | ICD-10-CM | POA: Diagnosis not present

## 2021-03-06 DIAGNOSIS — Z0000 Encounter for general adult medical examination without abnormal findings: Secondary | ICD-10-CM | POA: Diagnosis not present

## 2021-03-06 DIAGNOSIS — R82998 Other abnormal findings in urine: Secondary | ICD-10-CM | POA: Diagnosis not present

## 2021-03-06 DIAGNOSIS — I25810 Atherosclerosis of coronary artery bypass graft(s) without angina pectoris: Secondary | ICD-10-CM | POA: Diagnosis not present

## 2021-03-06 DIAGNOSIS — E119 Type 2 diabetes mellitus without complications: Secondary | ICD-10-CM | POA: Diagnosis not present

## 2021-03-06 DIAGNOSIS — I10 Essential (primary) hypertension: Secondary | ICD-10-CM | POA: Diagnosis not present

## 2021-03-06 DIAGNOSIS — I119 Hypertensive heart disease without heart failure: Secondary | ICD-10-CM | POA: Diagnosis not present

## 2021-03-06 DIAGNOSIS — Z1339 Encounter for screening examination for other mental health and behavioral disorders: Secondary | ICD-10-CM | POA: Diagnosis not present

## 2021-03-11 DIAGNOSIS — Z1212 Encounter for screening for malignant neoplasm of rectum: Secondary | ICD-10-CM | POA: Diagnosis not present

## 2021-03-24 DIAGNOSIS — I739 Peripheral vascular disease, unspecified: Secondary | ICD-10-CM | POA: Diagnosis not present

## 2021-03-24 DIAGNOSIS — I10 Essential (primary) hypertension: Secondary | ICD-10-CM | POA: Diagnosis not present

## 2021-03-24 DIAGNOSIS — Z6825 Body mass index (BMI) 25.0-25.9, adult: Secondary | ICD-10-CM | POA: Diagnosis not present

## 2021-03-24 DIAGNOSIS — G4733 Obstructive sleep apnea (adult) (pediatric): Secondary | ICD-10-CM | POA: Diagnosis not present

## 2021-03-24 DIAGNOSIS — E785 Hyperlipidemia, unspecified: Secondary | ICD-10-CM | POA: Diagnosis not present

## 2021-03-24 DIAGNOSIS — E663 Overweight: Secondary | ICD-10-CM | POA: Diagnosis not present

## 2021-03-24 DIAGNOSIS — I2510 Atherosclerotic heart disease of native coronary artery without angina pectoris: Secondary | ICD-10-CM | POA: Diagnosis not present

## 2021-03-24 DIAGNOSIS — I252 Old myocardial infarction: Secondary | ICD-10-CM | POA: Diagnosis not present

## 2021-03-24 DIAGNOSIS — R32 Unspecified urinary incontinence: Secondary | ICD-10-CM | POA: Diagnosis not present

## 2021-04-02 DIAGNOSIS — I119 Hypertensive heart disease without heart failure: Secondary | ICD-10-CM | POA: Diagnosis not present

## 2021-04-03 DIAGNOSIS — I119 Hypertensive heart disease without heart failure: Secondary | ICD-10-CM | POA: Diagnosis not present

## 2021-04-25 ENCOUNTER — Telehealth: Payer: Self-pay | Admitting: Cardiology

## 2021-05-05 NOTE — Telephone Encounter (Signed)
*  STAT* If patient is at the pharmacy, call can be transferred to refill team.   1. Which medications need to be refilled? (please list name of each medication and dose if known) atorvastatin (LIPITOR) 80 MG tablet  2. Which pharmacy/location (including street and city if local pharmacy) is medication to be sent to? [facility]  3. Do they need a 30 day or 90 day supply? 90 day  Patient is out of meds.

## 2021-07-05 NOTE — Progress Notes (Signed)
Krista Woods Date of Birth: 26-Sep-1950 Medical Record #[id number]  History of Present Illness: Krista Woods is seen for follow up of CAD and HTN.  Has known CAD with remote CABG in 2000. Other issues include HTN and HLD. Negative Myoview August 2016. She also has a strong family history of vascular disease. Her sister and 2 brothers have had an MI and daughter had a cerebral aneurysm. Another sister had a major stroke. States her older sister recently passed- unsure if this was heart related.   In 2019  she was diagnosed with heme positive stool.  She underwent upper EGD by Krista. Purnell Woods in January 2020, this revealed gastritis and small hiatal hernia. Her aspirin was decreased from 325mg  to 81mg  daily.   On follow up today she feels great. No chest pain or dyspnea. Is exercising getting 10 K steps a day. Has gained some weight since last visit. She feels well.    Current Outpatient Medications  Medication Sig Dispense Refill   Multiple Vitamin (MULTIVITAMIN) capsule Take 1 capsule by mouth daily.     amLODipine (NORVASC) 5 MG tablet Take 1 tablet (5 mg total) by mouth daily. 90 tablet 3   atorvastatin (LIPITOR) 80 MG tablet TAKE 1 TABLET(80 MG) BY MOUTH DAILY AT 6 PM 90 tablet 3   hydrochlorothiazide (HYDRODIURIL) 25 MG tablet TAKE 1 TABLET(25 MG) BY MOUTH DAILY 90 tablet 3   losartan (COZAAR) 100 MG tablet Take 1 tablet (100 mg total) by mouth daily. 90 tablet 3   metoprolol succinate (TOPROL-XL) 50 MG 24 hr tablet TAKE 1 TABLET(50 MG) BY MOUTH DAILY 90 tablet 3   No current facility-administered medications for this visit.    No Known Allergies  Past Medical History:  Diagnosis Date   Coronary disease    past CABG in September 2000 with LIMA to LAD, SVG to 1st DX, SVG to LCX   HTN (hypertension)    Hyperlipidemia    Myocardial infarction (Krista Woods) 02/1999   triple bypass surg   Stroke Krista Woods)    prior to open heart surgery    Past Surgical History:  Procedure Laterality Date    CARDIAC CATHETERIZATION  02/26/1999   ef 65%   CARDIOVASCULAR STRESS TEST  09/23/2008   EF 71%, NORMAL.   CORONARY ARTERY BYPASS GRAFT  September 2000   This included an LIMA graft to the LAD, saphenous vein graft to the first diagonal, saphenous vein graft to the circumflex    Social History   Tobacco Use  Smoking Status Never  Smokeless Tobacco Never    Social History   Substance and Sexual Activity  Alcohol Use No    Family History  Problem Relation Age of Onset   Heart attack Mother 48   Heart attack Sister 95       MI   Cerebral aneurysm Daughter    Diabetes Brother    Heart disease Brother    Diabetes Brother    Colon cancer Neg Hx    Esophageal cancer Neg Hx    Stomach cancer Neg Hx    Rectal cancer Neg Hx    Breast cancer Neg Hx     Review of Systems: The review of systems is per the HPI.  All other systems were reviewed and are negative.  Physical Exam: BP 126/70    Pulse (!) 57    Ht 5\' 3"  (1.6 m)    Wt 148 lb 6.4 oz (67.3 kg)    SpO2 98%  BMI 26.29 kg/m  GENERAL:  Well appearing BF in NAD HEENT:  PERRL, EOMI, sclera are clear. Oropharynx is clear. NECK:  No jugular venous distention, carotid upstroke brisk and symmetric, no bruits, no thyromegaly or adenopathy LUNGS:  Clear to auscultation bilaterally CHEST:  Unremarkable HEART:  RRR,  PMI not displaced or sustained,S1 and S2 within normal limits, no S3, no S4: no clicks, no rubs, no murmurs ABD:  Soft, nontender. BS +, no masses or bruits. No hepatomegaly, no splenomegaly EXT:  2 + pulses throughout, no edema, no cyanosis no clubbing SKIN:  Warm and dry.  No rashes NEURO:  Alert and oriented x 3. Cranial nerves II through XII intact. PSYCH:  Cognitively intact    LABORATORY DATA: Lab Results  Component Value Date   WBC 6.7 03/08/2014   HGB 13.8 03/08/2014   HCT 39.7 03/08/2014   PLT 270 03/08/2014   GLUCOSE 115 (H) 01/28/2017   CHOL 133 01/28/2017   TRIG 40 01/28/2017   HDL 65 01/28/2017    LDLCALC 60 01/28/2017   ALT 13 01/28/2017   AST 15 01/28/2017   NA 140 01/28/2017   K 4.0 01/28/2017   CL 98 01/28/2017   CREATININE 0.78 01/28/2017   BUN 13 01/28/2017   CO2 28 01/28/2017   Dated 01/31/20: cholesterol 149, triglycerides 44, HDL 71, LDL 69. A1c 6.2%. CMET and TSH normal.  Ecg today: NSR rate 57. Normal. I have personally reviewed and interpreted this study.   Myoview: 02/05/15: Study Highlights     The left ventricular ejection fraction is normal (55-65%). Nuclear stress EF: 65%. Blood pressure demonstrated a hypertensive response to exercise. Downsloping ST segment depression ST segment depression of 3 mm was noted during stress in the II, III, aVF, V5, V6 and V4 leads, beginning at 2 minutes of stress, and returning to baseline after 1-5 minutes of recovery. Defect 1: There is a small defect of mild severity. This is a low risk study.   Low risk study. Small, mild fixed inferoapical and apical lateral defect which is likely artifact. No reversible ischemia. LVEF 65% with normal wall motion. There was significant 3 mm downsloping ST depression inferiorly and laterally on the stress EKG with exercise, however, there is voltage criteria for LVH, baseline wander is noted and this could represent repolarization abnormality or ischemia. Exercise tolerance was fair with no chest pain. There was a marked hypertensive response to exercise.     Assessment / Plan: 1. CAD - remote CABG in 2000 - negative Myoview in August 2016. asymptomatic. Continue current medical therapy.  2. HTN - well controlled on multiple medications. Refilled.   3. HLD - has been under excellent control- continue lipitor. Labs followed by Krista Woods.  4. Prediabetes. Last A1c 6.2%. dietary therapy. Control weight.   I will follow up in 6 months

## 2021-07-14 ENCOUNTER — Ambulatory Visit: Payer: Medicare PPO | Admitting: Cardiology

## 2021-07-14 ENCOUNTER — Other Ambulatory Visit: Payer: Self-pay

## 2021-07-14 ENCOUNTER — Encounter: Payer: Self-pay | Admitting: Cardiology

## 2021-07-14 VITALS — BP 126/70 | HR 57 | Ht 63.0 in | Wt 148.4 lb

## 2021-07-14 DIAGNOSIS — I25810 Atherosclerosis of coronary artery bypass graft(s) without angina pectoris: Secondary | ICD-10-CM

## 2021-07-14 DIAGNOSIS — E785 Hyperlipidemia, unspecified: Secondary | ICD-10-CM | POA: Diagnosis not present

## 2021-07-14 DIAGNOSIS — E119 Type 2 diabetes mellitus without complications: Secondary | ICD-10-CM | POA: Diagnosis not present

## 2021-07-14 DIAGNOSIS — I10 Essential (primary) hypertension: Secondary | ICD-10-CM | POA: Diagnosis not present

## 2021-07-14 MED ORDER — AMLODIPINE BESYLATE 5 MG PO TABS: 5.0000 mg | ORAL_TABLET | Freq: Every day | ORAL | 3 refills | Status: AC

## 2021-07-14 MED ORDER — LOSARTAN POTASSIUM 100 MG PO TABS: 100.0000 mg | ORAL_TABLET | Freq: Every day | ORAL | 3 refills | Status: AC

## 2021-07-14 MED ORDER — ATORVASTATIN CALCIUM 80 MG PO TABS: ORAL_TABLET | ORAL | 3 refills | Status: DC

## 2021-07-14 MED ORDER — METOPROLOL SUCCINATE ER 50 MG PO TB24: ORAL_TABLET | ORAL | 3 refills | Status: AC

## 2021-07-14 MED ORDER — HYDROCHLOROTHIAZIDE 25 MG PO TABS: ORAL_TABLET | ORAL | 3 refills | Status: AC

## 2021-07-24 DIAGNOSIS — G4733 Obstructive sleep apnea (adult) (pediatric): Secondary | ICD-10-CM | POA: Diagnosis not present

## 2021-07-24 DIAGNOSIS — J309 Allergic rhinitis, unspecified: Secondary | ICD-10-CM | POA: Diagnosis not present

## 2021-07-24 DIAGNOSIS — E119 Type 2 diabetes mellitus without complications: Secondary | ICD-10-CM | POA: Diagnosis not present

## 2021-07-24 DIAGNOSIS — Z6826 Body mass index (BMI) 26.0-26.9, adult: Secondary | ICD-10-CM | POA: Diagnosis not present

## 2021-07-24 DIAGNOSIS — E785 Hyperlipidemia, unspecified: Secondary | ICD-10-CM | POA: Diagnosis not present

## 2021-07-24 DIAGNOSIS — I252 Old myocardial infarction: Secondary | ICD-10-CM | POA: Diagnosis not present

## 2021-07-24 DIAGNOSIS — I2510 Atherosclerotic heart disease of native coronary artery without angina pectoris: Secondary | ICD-10-CM | POA: Diagnosis not present

## 2021-07-24 DIAGNOSIS — E663 Overweight: Secondary | ICD-10-CM | POA: Diagnosis not present

## 2021-07-24 DIAGNOSIS — I10 Essential (primary) hypertension: Secondary | ICD-10-CM | POA: Diagnosis not present

## 2021-07-27 DIAGNOSIS — I25810 Atherosclerosis of coronary artery bypass graft(s) without angina pectoris: Secondary | ICD-10-CM | POA: Diagnosis not present

## 2021-07-27 DIAGNOSIS — I10 Essential (primary) hypertension: Secondary | ICD-10-CM | POA: Diagnosis not present

## 2021-07-27 DIAGNOSIS — G4733 Obstructive sleep apnea (adult) (pediatric): Secondary | ICD-10-CM | POA: Diagnosis not present

## 2021-07-27 DIAGNOSIS — E119 Type 2 diabetes mellitus without complications: Secondary | ICD-10-CM | POA: Diagnosis not present

## 2021-07-27 DIAGNOSIS — E1151 Type 2 diabetes mellitus with diabetic peripheral angiopathy without gangrene: Secondary | ICD-10-CM | POA: Diagnosis not present

## 2021-07-27 DIAGNOSIS — I739 Peripheral vascular disease, unspecified: Secondary | ICD-10-CM | POA: Diagnosis not present

## 2021-07-27 DIAGNOSIS — E785 Hyperlipidemia, unspecified: Secondary | ICD-10-CM | POA: Diagnosis not present

## 2021-09-01 ENCOUNTER — Encounter: Payer: Self-pay | Admitting: Gastroenterology

## 2022-01-20 ENCOUNTER — Other Ambulatory Visit: Payer: Self-pay | Admitting: Cardiology

## 2022-02-05 ENCOUNTER — Other Ambulatory Visit: Payer: Self-pay | Admitting: Internal Medicine

## 2022-02-05 DIAGNOSIS — Z1231 Encounter for screening mammogram for malignant neoplasm of breast: Secondary | ICD-10-CM

## 2022-02-12 ENCOUNTER — Ambulatory Visit
Admission: RE | Admit: 2022-02-12 | Discharge: 2022-02-12 | Disposition: A | Discharge status: Home / Self Care | Payer: Medicare PPO | Source: Ambulatory Visit | Attending: Internal Medicine | Admitting: Internal Medicine

## 2022-02-12 DIAGNOSIS — Z1231 Encounter for screening mammogram for malignant neoplasm of breast: Secondary | ICD-10-CM

## 2023-01-24 ENCOUNTER — Other Ambulatory Visit: Payer: Self-pay | Admitting: Cardiology

## 2023-03-16 ENCOUNTER — Other Ambulatory Visit: Payer: Self-pay | Admitting: Cardiology

## 2023-03-26 ENCOUNTER — Other Ambulatory Visit: Payer: Self-pay | Admitting: Internal Medicine

## 2023-03-26 ENCOUNTER — Ambulatory Visit
Admission: RE | Admit: 2023-03-26 | Discharge: 2023-03-26 | Disposition: A | Discharge status: Home / Self Care | Payer: Medicare Other | Source: Ambulatory Visit | Attending: Internal Medicine | Admitting: Internal Medicine

## 2023-03-26 DIAGNOSIS — Z1231 Encounter for screening mammogram for malignant neoplasm of breast: Secondary | ICD-10-CM

## 2023-04-06 NOTE — Progress Notes (Signed)
Patient attended screening event 03/26/2023. Patient advised to check with provider for blood pressure of 133/86. Patient declined SDOH screening.

## 2023-04-26 ENCOUNTER — Other Ambulatory Visit: Payer: Self-pay | Admitting: Cardiology

## 2023-05-13 ENCOUNTER — Other Ambulatory Visit: Payer: Self-pay | Admitting: Cardiology

## 2023-05-31 ENCOUNTER — Other Ambulatory Visit: Payer: Self-pay | Admitting: Cardiology

## 2023-07-01 NOTE — Progress Notes (Signed)
 The patient attended 03/26/23 screening event where her BP screening results were 133/86. At the event the patient noted she had a pcp and does not smoke. Patient  declined SDOH questionnaire. Per chart review the pt does have a pcp and the last visit with pcp was 06/03/23. Pt has insurance according to the chart. Hypertension is already listed in the patient's problem list. Per chart review pt takes amplodipine. Chart review did not indicate any future appts. No additional Health equity team support indicated at this time.

## 2024-03-15 LAB — HEMOGLOBIN A1C: Hemoglobin A1C: 6.5

## 2024-03-15 LAB — GLUCOSE, POCT (MANUAL RESULT ENTRY): Glucose Fasting, POC: 139 mg/dL — AB (ref 70–99)

## 2024-03-15 NOTE — Progress Notes (Signed)
 Pt screened for HTN, fasting BS and A1C. BP taken x 2. Educated pt on risks of uncontrolled HTN. She reports she has not taken her BP medication  this AM and will do so when she eats. Educated her on healthy eating habits, blood sugar management and life style changes. She has a scheduled follow up appointment with PCP

## 2024-05-02 NOTE — Progress Notes (Signed)
 The patient attended a screening event on 03/15/2024, where her blood pressure was measured at 148/82 mmHg after a second check, her fasting blood glucose was 139 mg/dl, and her J8R was 3.4%, both placing her in the diabetic range. During the event, the patient did not report her smoking status and insurance. She is established with a PCP (Dr. Jakie?), and declined the SDOH screener.  A chart review indicates Dr. Toribio Shan - [facility] as her PCP. The patient's most recent office visit was on 01/23/2024. She does not have any [facility] visible upcoming appts at this time. Pt has a history of bp and blood glucose, she is taking medications for it. She has [employer] and [employer]. There are currently no SDOH indicated in the last 12 months she does have a housing concern from Sept 28, 2024. Chart review indicates she is not a smoker.  Call Attempt #1: CHW called PCP office to obtain appt status. Front desk forward CHW to PCP's CMA vm to record message. Voice message was left for care team to return call to CHW.   Call Attempt #2: CHW called pt at home number to obtain appt status. CHW was unable to reach the pt at this number.   Call Attempt #3: CHW called pt at mobile number and left vm to return call to discuss screening event.  CHW sent courtesy letter with bp and blood glucose education plus [employer] flyer in case needed by the pt.   No additional Health [facility] team support indicated at this time.

## 2024-05-14 ENCOUNTER — Other Ambulatory Visit: Payer: Self-pay | Admitting: Internal Medicine

## 2024-05-14 DIAGNOSIS — Z1231 Encounter for screening mammogram for malignant neoplasm of breast: Secondary | ICD-10-CM

## 2024-05-17 ENCOUNTER — Ambulatory Visit
Admission: RE | Admit: 2024-05-17 | Discharge: 2024-05-17 | Disposition: A | Discharge status: Home / Self Care | Source: Ambulatory Visit | Attending: Internal Medicine | Admitting: Internal Medicine

## 2024-05-17 DIAGNOSIS — Z1231 Encounter for screening mammogram for malignant neoplasm of breast: Secondary | ICD-10-CM
